# Patient Record
Sex: Male | Born: 1985 | Race: White | Hispanic: No | Marital: Single | Smoking: Never smoker
Health system: Southern US, Community
[De-identification: ages and names within clinical notes are randomized; demographics above are authoritative.]

## PROBLEM LIST (undated history)

## (undated) DIAGNOSIS — F32A Depression, unspecified: Secondary | ICD-10-CM

## (undated) DIAGNOSIS — F329 Major depressive disorder, single episode, unspecified: Secondary | ICD-10-CM

---

## 2013-02-08 ENCOUNTER — Emergency Department (HOSPITAL_COMMUNITY)
Admission: EM | Admit: 2013-02-08 | Discharge: 2013-02-08 | Disposition: A | Discharge status: Home / Self Care | Payer: Self-pay | Attending: Emergency Medicine | Admitting: Emergency Medicine

## 2013-02-08 ENCOUNTER — Encounter (HOSPITAL_COMMUNITY): Payer: Self-pay | Admitting: Emergency Medicine

## 2013-02-08 DIAGNOSIS — B029 Zoster without complications: Secondary | ICD-10-CM | POA: Insufficient documentation

## 2013-02-08 LAB — URINALYSIS, ROUTINE W REFLEX MICROSCOPIC
Glucose, UA: NEGATIVE mg/dL
Hgb urine dipstick: NEGATIVE
Specific Gravity, Urine: >1.03 — ABNORMAL HIGH (ref 1.005–1.030)
pH: 6 (ref 5.0–8.0)

## 2013-02-08 MED ORDER — OXYCODONE-ACETAMINOPHEN 5-325 MG PO TABS: 1.0000 | ORAL_TABLET | Freq: Four times a day (QID) | ORAL | Status: DC | PRN

## 2013-02-08 MED ORDER — VALACYCLOVIR HCL 1 G PO TABS: 1000.0000 mg | ORAL_TABLET | Freq: Three times a day (TID) | ORAL | Status: AC

## 2013-02-08 MED ORDER — PREDNISONE 20 MG PO TABS: ORAL_TABLET | ORAL | Status: DC

## 2013-02-08 NOTE — ED Provider Notes (Signed)
History  This chart was scribed for Donnetta Hutching, MD, by Candelaria Stagers, ED Scribe. This patient was seen in room APA19/[facility] and the patient's care was started at 7:10 AM   CSN: [id number]  Arrival date & time 02/08/13  0610   First MD Initiated Contact with Patient 02/08/13 [phone]      Chief Complaint  Patient presents with  . Abdominal Pain     The history is provided by the patient. No language interpreter was used.   Scott Mcgrath is a 27 y.o. male who presents to the Emergency Department complaining of a painful rash to the right mid back that extends to his right lateral abdomen which started about one week ago.  Pt reports the pain is worse with lifting heavy objects at work.  He denies any associated sx.  Nothing seems to make the sx better or worse.  He has never experienced a similar rash.      History reviewed. No pertinent past medical history.  History reviewed. No pertinent past surgical history.  No family history on file.  History  Substance Use Topics  . Smoking status: Never Smoker   . Smokeless tobacco: Not on file  . Alcohol Use: Yes      Review of Systems  Constitutional: Negative for fever and chills.  Skin: Positive for rash (to right lower back and right lateral abdomen).  All other systems reviewed and are negative.    Allergies  Review of patient's allergies indicates no known allergies.  Home Medications  No current outpatient prescriptions on file.  BP 137/71  Pulse 83  Temp(Src) 97.9 F (36.6 C) (Oral)  Resp 20  Ht 6' (1.829 m)  Wt 170 lb (77.111 kg)  BMI 23.05 kg/m2  SpO2 100%  Physical Exam  Nursing note and vitals reviewed. Constitutional: He is oriented to person, place, and time. He appears well-developed and well-nourished. No distress.  HENT:  Head: Normocephalic and atraumatic.  Eyes: Conjunctivae and EOM are normal.  Neck: Neck supple. No tracheal deviation present.  Cardiovascular: Normal rate.   Pulmonary/Chest:  Effort normal. No respiratory distress.  Abdominal: He exhibits no distension.  Musculoskeletal: Normal range of motion.  Neurological: He is alert and oriented to person, place, and time. No sensory deficit.  Skin: Skin is dry.  Right mid lower back area with an area about 12 cm in diameter of erythematous papular excoriation with some blistering.  On right lateral abdomen there is a linear patch about 1 x 7 cm of the same description.  Psychiatric: He has a normal mood and affect. His behavior is normal.    ED Course  Procedures   DIAGNOSTIC STUDIES: Oxygen Saturation is 100% on room air, normal by my interpretation.    COORDINATION OF CARE:  7:15 AM Discussed course of care for shingles which includes antiviral medication.  Pt understands and agrees.     Labs Reviewed  URINALYSIS, ROUTINE W REFLEX MICROSCOPIC - Abnormal; Notable for the following:    APPearance HAZY (*)    Specific Gravity, Urine >1.030 (*)    All other components within normal limits   No results found.   No diagnosis found.    MDM  History and physical consistent with shingle in approximately T 8 dermatome.  Rx antiviral, prednisone,  Pain medication I personally performed the services described in this documentation, which was scribed in my presence. The recorded information has been reviewed and is accurate.  Donnetta Hutching, MD 02/08/13 [phone]

## 2013-02-08 NOTE — ED Notes (Signed)
DR. Adriana Simas at bedside,

## 2013-02-08 NOTE — ED Notes (Signed)
Patient c/o right lower quadrant pain and right lower abdominal pain x 3 days.  Patient denies any nausea, vomiting, diarrhea or urinary symptoms.  Patient ambulatory with steady gait.  A&O; skin w/d. Respirations even and unlabored; able to speak in complete sentences without difficulty.  Patient states had poison in same areas.

## 2014-10-26 ENCOUNTER — Emergency Department: Payer: Self-pay | Admitting: Internal Medicine

## 2014-11-03 ENCOUNTER — Emergency Department: Payer: Self-pay | Admitting: Emergency Medicine

## 2015-05-24 ENCOUNTER — Other Ambulatory Visit: Payer: Self-pay

## 2015-05-24 ENCOUNTER — Emergency Department
Admission: EM | Admit: 2015-05-24 | Discharge: 2015-05-24 | Disposition: A | Discharge status: Home / Self Care | Payer: TRICARE For Life (TFL) | Attending: Emergency Medicine | Admitting: Emergency Medicine

## 2015-05-24 ENCOUNTER — Encounter: Payer: Self-pay | Admitting: Emergency Medicine

## 2015-05-24 DIAGNOSIS — R112 Nausea with vomiting, unspecified: Secondary | ICD-10-CM | POA: Insufficient documentation

## 2015-05-24 DIAGNOSIS — R51 Headache: Secondary | ICD-10-CM | POA: Insufficient documentation

## 2015-05-24 DIAGNOSIS — F329 Major depressive disorder, single episode, unspecified: Secondary | ICD-10-CM | POA: Insufficient documentation

## 2015-05-24 DIAGNOSIS — T672XXA Heat cramp, initial encounter: Secondary | ICD-10-CM | POA: Insufficient documentation

## 2015-05-24 DIAGNOSIS — X30XXXA Exposure to excessive natural heat, initial encounter: Secondary | ICD-10-CM | POA: Insufficient documentation

## 2015-05-24 HISTORY — DX: Depression, unspecified: F32.A

## 2015-05-24 HISTORY — DX: Major depressive disorder, single episode, unspecified: F32.9

## 2015-05-24 LAB — CBC WITH DIFFERENTIAL/PLATELET
BASOS PCT: 0 %
Basophils Absolute: 0 10*3/uL (ref 0–0.1)
EOS PCT: 2 %
Eosinophils Absolute: 0.2 10*3/uL (ref 0–0.7)
HCT: 47.5 % (ref 40.0–52.0)
HEMOGLOBIN: 16.5 g/dL (ref 13.0–18.0)
LYMPHS PCT: 39 %
Lymphs Abs: 5.1 10*3/uL — ABNORMAL HIGH (ref 1.0–3.6)
MCH: 30.3 pg (ref 26.0–34.0)
MCHC: 34.6 g/dL (ref 32.0–36.0)
MCV: 87.6 fL (ref 80.0–100.0)
MONO ABS: 0.6 10*3/uL (ref 0.2–1.0)
Monocytes Relative: 5 %
NEUTROS PCT: 54 %
Neutro Abs: 7 10*3/uL — ABNORMAL HIGH (ref 1.4–6.5)
Platelets: 293 10*3/uL (ref 150–440)
RBC: 5.42 MIL/uL (ref 4.40–5.90)
RDW: 12.8 % (ref 11.5–14.5)
WBC: 12.9 10*3/uL — ABNORMAL HIGH (ref 3.8–10.6)

## 2015-05-24 LAB — COMPREHENSIVE METABOLIC PANEL
ALT: 25 U/L (ref 17–63)
ANION GAP: 18 — AB (ref 5–15)
AST: 31 U/L (ref 15–41)
Albumin: 5.4 g/dL — ABNORMAL HIGH (ref 3.5–5.0)
Alkaline Phosphatase: 59 U/L (ref 38–126)
BUN: 20 mg/dL (ref 6–20)
CHLORIDE: 107 mmol/L (ref 101–111)
CO2: 17 mmol/L — ABNORMAL LOW (ref 22–32)
Calcium: 10.4 mg/dL — ABNORMAL HIGH (ref 8.9–10.3)
Creatinine, Ser: 1.65 mg/dL — ABNORMAL HIGH (ref 0.61–1.24)
GFR calc Af Amer: >60 mL/min (ref >60)
GFR, EST NON AFRICAN AMERICAN: 55 mL/min — AB (ref >60)
GLUCOSE: 141 mg/dL — AB (ref 65–99)
Potassium: 3.6 mmol/L (ref 3.5–5.1)
Sodium: 142 mmol/L (ref 135–145)
TOTAL PROTEIN: 8.6 g/dL — AB (ref 6.5–8.1)
Total Bilirubin: 1.5 mg/dL — ABNORMAL HIGH (ref 0.3–1.2)

## 2015-05-24 MED ORDER — METOCLOPRAMIDE HCL 5 MG/ML IJ SOLN
10.0000 mg | Freq: Once | INTRAMUSCULAR | Status: AC
  Filled 2015-05-24: qty 2

## 2015-05-24 MED ORDER — SODIUM CHLORIDE 0.9 % IV BOLUS (SEPSIS)
1000.0000 mL | Freq: Once | INTRAVENOUS | Status: AC
  Administered 2015-05-24: 1000 mL via INTRAVENOUS

## 2015-05-24 MED ORDER — ONDANSETRON HCL 4 MG/2ML IJ SOLN: 4.0000 mg | Freq: Once | INTRAMUSCULAR | Status: DC

## 2015-05-24 MED ORDER — METOCLOPRAMIDE HCL 5 MG/ML IJ SOLN
INTRAMUSCULAR | Status: AC
  Administered 2015-05-24: 10 mg via INTRAVENOUS
  Filled 2015-05-24: qty 2

## 2015-05-24 NOTE — Discharge Instructions (Signed)
It is not clear if you're nausea and vomiting is due to your current medicines, due to heat illness, or due to a combination of both. Follow-up with your doctors at the [facility] to discuss changes, if needed, and your medications. The sure to drink plenty of fluid. Return the emergency department if you have any further symptoms or urgent concerns.  Heat-Related Illness Heat-related illnesses occur when the body is unable to properly cool itself. The body normally cools itself by sweating. However, under some conditions sweating is not enough. In these cases, a person's body temperature rises rapidly. Very high body temperatures may damage the brain or other vital organs. Some examples of heat-related illnesses include:  Heat stroke. This occurs when the body is unable to regulate its temperature. The body's temperature rises rapidly, the sweating mechanism fails, and the body is unable to cool down. Body temperature may rise to 106 F (41 C) or higher within 10 to 15 minutes. Heat stroke can cause death or permanent disability if emergency treatment is not provided.  Heat exhaustion. This is a milder form of heat-related illness that can develop after several days of exposure to high temperatures and not enough fluids. It is the body's response to an excessive loss of the water and salt contained in sweat.  Heat cramps. These usually affect people who sweat a lot during heavy activity. This sweating drains the body's salt and moisture. The low salt level in the muscles causes painful cramps. Heat cramps may also be a symptom of heat exhaustion. Heat cramps usually occur in the abdomen, arms, or legs. Get medical attention for cramps if you have heart problems or are on a low-sodium diet. Those that are at greatest risk for heat-related illnesses include:   The elderly.  Infant and the very young.  People with mental illness and chronic diseases.  People who are overweight (obese).  Young and healthy  people can even succumb to heat if they participate in strenuous physical activities during hot weather. CAUSES  Several factors affect the body's ability to cool itself during extremely hot weather. When the humidity is high, sweat will not evaporate as quickly. This prevents the body from releasing heat quickly. Other factors that can affect the body's ability to cool down include:   Age.  Obesity.  Fever.  Dehydration.  Heart disease.  Mental illness.  Poor circulation.  Sunburn.  Prescription drug use.  Alcohol use. SYMPTOMS  Heat stroke: Warning signs of heat stroke vary, but may include:  An extremely high body temperature (above 103F orally).  A fast, strong pulse.  Dizziness.  Confusion.  Red, hot, and dry skin.  No sweating.  Throbbing headache.  Feeling sick to your stomach (nauseous).  Unconsciousness. Heat exhaustion: Warning signs of heat exhaustion include:  Heavy sweating.  Tiredness.  Headache.  Paleness.  Weakness.  Feeling sick to your stomach (nauseous) or vomiting.  Muscle cramps. Heat cramps  Muscle pains or spasms. TREATMENT  Heat stroke  Get into a cool environment. An indoor place that is air-conditioned may be best.  Take a cool shower or bath. Have someone around to make sure you are okay.  Take your temperature. Make sure it is going down. Heat exhaustion  Drink plenty of fluids. Do not drink liquids that contain caffeine, alcohol, or large amounts of sugar. These cause you to lose more body fluid. Also, avoid very cold drinks. They can cause stomach cramps.  Get into a cool environment. An indoor place  that is air-conditioned may be best.  Take a cool shower or bath. Have someone around to make sure you are okay.  Put on lightweight clothing. Heat cramps  Stop whatever activity you were doing. Do not attempt to do that activity for at least 3 hours after the cramps have gone away.  Get into a cool  environment. An indoor place that is air-conditioned may be best. HOME CARE INSTRUCTIONS  To protect your health when temperatures are extremely high, follow these tips:  During heavy exercise in a hot environment, drink two to four glasses (16-32 ounces) of cool fluids each hour. Do not wait until you are thirsty to drink. Warning: If your caregiver limits the amount of fluid you drink or has you on water pills, ask how much you should drink while the weather is hot.  Do not drink liquids that contain caffeine, alcohol, or large amounts of sugar. These cause you to lose more body fluid.  Avoid very cold drinks. They can cause stomach cramps.  Wear appropriate clothing. Choose lightweight, light-colored, loose-fitting clothing.  If you must be outdoors, try to limit your outdoor activity to morning and evening hours. Try to rest often in shady areas.  If you are not used to working or exercising in a hot environment, start slowly and pick up the pace gradually.  Stay cool in an air-conditioned place if possible. If your home does not have air conditioning, go to the shopping mall or [employer].  Taking a cool shower or bath may help you cool off. SEEK MEDICAL CARE IF:   You see any of the symptoms listed above. You may be dealing with a life-threatening emergency.  Symptoms worsen or last longer than 1 hour.  Heat cramps do not get better in 1 hour. MAKE SURE YOU:   Understand these instructions.  Will watch your condition.  Will get help right away if you are not doing well or get worse. Document Released: 08/07/2008 Document Revised: 01/21/2012 Document Reviewed: 08/07/2008 [facility] Patient Information 2015 [place], [place]. This information is not intended to replace advice given to you by your health care provider. Make sure you discuss any questions you have with your health care provider.

## 2015-05-24 NOTE — ED Notes (Addendum)
Pt to triage very weak and sweaty; reports he's been working outside today since 730 am. Pt reports drinking water. Pt's shirt is soaked in sweat. Pt vomiting in triage room. Coworker reports pt was vomiting before work; pt reports he started taking cymbalta and trazodone Sunday, has been vomiting daily since Monday.

## 2015-05-24 NOTE — ED Provider Notes (Signed)
[facility] Emergency Department Provider Note  ____________________________________________  Time seen: 64  I have reviewed the triage vital signs and the nursing notes.   HISTORY  Chief Complaint Heat Exposure  nausea, vomiting    HPI Scott Mcgrath is a 29 y.o. male who began taking new medications last Friday, began feeling nauseous on Sunday, in became nauseous and began vomiting today while working outside.  He began taking Cymbalta and trazodone on Friday. The patient reports a history of depression. These medicines are prescribed at the [facility].  The patient reports having nausea since Sunday. This morning he woke up with a little bit of nausea but otherwise felt all right. 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  30 minutes prior to his arrival, the patient began to feel increasingly nauseous and vomiting. His hands started to spasm and cramps. He felt very uncomfortable. He presents to the emergency department drenched in sweat.  He reports he has been drinking a fair amount of foreign Gatorade during the day. He denies any near syncope, chest pain, shortness breath, or abdominal pain. He has had a small amount of diarrhea earlier today.    Past Medical History  Diagnosis Date  . Depression     There are no active problems to display for this patient.   History reviewed. No pertinent past surgical history.  Current Outpatient Rx  Name  Route  Sig  Dispense  Refill  . oxyCODONE-acetaminophen (PERCOCET/ROXICET) 5-325 MG per tablet   Oral   Take 1-2 tablets by mouth every 6 (six) hours as needed for pain.   20 tablet   0   . predniSONE (DELTASONE) 20 MG tablet      3 tabs po daily x 3 days, then 2 tabs x 3 days, then 1.5 tabs x 3 days, then 1 tab x 3 days, then 0.5 tabs x 3 days   27 tablet   0     Allergies Review of patient's allergies indicates no known allergies.  No family history on file.  Social History History   Substance Use Topics  . Smoking status: Never Smoker   . Smokeless tobacco: Current User    Types: Chew  . Alcohol Use: No    Review of Systems  Constitutional: Negative for fever. ENT: Negative for sore throat. Cardiovascular: Negative for chest pain. Respiratory: Negative for shortness of breath. Gastrointestinal: Negative for abdominal pain, positive for vomiting and diarrhea. See history of present illness Genitourinary: Negative for dysuria. Musculoskeletal: No myalgias or injuries. Skin: Negative for rash. Neurological: New-onset headache currently. Psychological: History of depression, recently started on Cymbalta and trazodone.  10-point ROS otherwise negative.  ____________________________________________   PHYSICAL EXAM:   VITAL SIGNS: ED Triage Vitals  Enc Vitals Group     BP 05/24/15 1442 119/70 mmHg     Pulse Rate 05/24/15 1442 87     Resp 05/24/15 1442 24     Temp --      Temp src --      SpO2 05/24/15 1442 95 %     Weight 05/24/15 1442 180 lb (81.647 kg)     Height --      Head Cir --      Peak Flow --      Pain Score 05/24/15 1445 0     Pain Loc --      Pain Edu? --      Excl. in GC? --     Constitutional: Alert and oriented. A  bit red in the face, cool and a little clammy to touch at this time. ENT   Head: Normocephalic and atraumatic.   Nose: No congestion/rhinnorhea.   Mouth/Throat: Mucous membranes are moist. Cardiovascular: Normal rate, regular rhythm, no murmur noted Respiratory:  Normal respiratory effort, no tachypnea.    Breath sounds are clear and equal bilaterally.  Gastrointestinal: Soft and nontender. No distention.  Back: No muscle spasm, no tenderness, no CVA tenderness. Musculoskeletal: No deformity noted. Nontender with normal range of motion in all extremities.  No noted edema. Neurologic:  Normal speech and language. No gross focal neurologic deficits are appreciated.  Skin:  Skin is cool and clammy.   Psychiatric: Mood and affect are normal. Speech and behavior are normal.  ____________________________________________    LABS (pertinent positives/negatives)  Labs Reviewed  COMPREHENSIVE METABOLIC PANEL - Abnormal; Notable for the following:    CO2 17 (*)    Glucose, Bld 141 (*)    Creatinine, Ser 1.65 (*)    Calcium 10.4 (*)    Total Protein 8.6 (*)    Albumin 5.4 (*)    Total Bilirubin 1.5 (*)    GFR calc non Af Amer 55 (*)    Anion gap 18 (*)    All other components within normal limits  CBC WITH DIFFERENTIAL/PLATELET - Abnormal; Notable for the following:    WBC 12.9 (*)    Neutro Abs 7.0 (*)    Lymphs Abs 5.1 (*)    All other components within normal limits  URINALYSIS COMPLETEWITH MICROSCOPIC ([facility] ONLY)     ____________________________________________   EKG  ED ECG REPORT I, Peja Allender W, the attending physician, personally viewed and interpreted this ECG.   Date: 05/24/2015  EKG Time: 1443  Rate: 84  Rhythm: Normal sinus rhythm  Axis: Normal  Intervals: Normal  ST&T Change: None noted   ____________________________________________   INITIAL IMPRESSION / ASSESSMENT AND PLAN / ED COURSE  Pertinent labs & imaging results that were available during my care of the patient were reviewed by me and considered in my medical decision making (see chart for details).  Is not clear if this is heat frustration versus a different cause for his nausea with some mild diarrhea earlier today. The patient has been taking adequate by mouth fluids. Today is not as hot as it has recently been. We do not have a temperature posted for this patient yet but will obtain one. I suspect that the patient is having difficulty due to the nausea and vomiting his head directly.  Regardless, he will receive 1 L of normal saline IV. I will give him some Reglan to treat the nausea but also because the patient is complaining of a migraine  headache.  ----------------------------------------- 4:29 PM on 05/24/2015 -----------------------------------------  Patient's blood tests overall look fairly good. He has some mild elevation in his creatinine at 1.65, which given his muscular fit body habitus does not surprise me too much. His CO2 is low at 17. The patient was rather anxious and uncomfortable and hyperventilating when he first arrived. Glucose is 141.  Patient has improved. He feels well. His headache is improved. He is not having any nausea at this time.  His temperature this time is 97.8.  It's unclear if this was purely related or medication related or a combination. I suspect combination.  I've recommended that he follow-up at the [facility] [place] [facility] for further discussion about his current medications.  ____________________________________________   FINAL CLINICAL IMPRESSION(S) / ED DIAGNOSES  Final diagnoses:  Nausea and vomiting, vomiting of unspecified type  Heat cramp, initial encounter      Darien Ramusavid W Aliesha Dolata, MD 05/24/15 [phone]

## 2015-09-15 ENCOUNTER — Encounter (HOSPITAL_COMMUNITY): Payer: Self-pay | Admitting: Emergency Medicine

## 2015-09-15 ENCOUNTER — Emergency Department (HOSPITAL_COMMUNITY)
Admission: EM | Admit: 2015-09-15 | Discharge: 2015-09-16 | Disposition: A | Discharge status: Home / Self Care | Payer: Self-pay | Attending: Emergency Medicine | Admitting: Emergency Medicine

## 2015-09-15 ENCOUNTER — Emergency Department (HOSPITAL_COMMUNITY): Payer: TRICARE For Life (TFL)

## 2015-09-15 DIAGNOSIS — R197 Diarrhea, unspecified: Secondary | ICD-10-CM | POA: Insufficient documentation

## 2015-09-15 DIAGNOSIS — Z8659 Personal history of other mental and behavioral disorders: Secondary | ICD-10-CM | POA: Insufficient documentation

## 2015-09-15 DIAGNOSIS — R112 Nausea with vomiting, unspecified: Secondary | ICD-10-CM | POA: Insufficient documentation

## 2015-09-15 DIAGNOSIS — J40 Bronchitis, not specified as acute or chronic: Secondary | ICD-10-CM | POA: Insufficient documentation

## 2015-09-15 DIAGNOSIS — Z7952 Long term (current) use of systemic steroids: Secondary | ICD-10-CM | POA: Insufficient documentation

## 2015-09-15 DIAGNOSIS — M6281 Muscle weakness (generalized): Secondary | ICD-10-CM | POA: Insufficient documentation

## 2015-09-15 LAB — CBC WITH DIFFERENTIAL/PLATELET
BASOS PCT: 0 %
Basophils Absolute: 0 10*3/uL (ref 0.0–0.1)
EOS ABS: 0.1 10*3/uL (ref 0.0–0.7)
Eosinophils Relative: 1 %
HCT: 45.4 % (ref 39.0–52.0)
HEMOGLOBIN: 16.3 g/dL (ref 13.0–17.0)
LYMPHS ABS: 0.6 10*3/uL — AB (ref 0.7–4.0)
Lymphocytes Relative: 6 %
MCH: 30.9 pg (ref 26.0–34.0)
MCHC: 35.9 g/dL (ref 30.0–36.0)
MCV: 86.1 fL (ref 78.0–100.0)
Monocytes Absolute: 0.8 10*3/uL (ref 0.1–1.0)
Monocytes Relative: 7 %
NEUTROS PCT: 86 %
Neutro Abs: 9.8 10*3/uL — ABNORMAL HIGH (ref 1.7–7.7)
Platelets: 219 10*3/uL (ref 150–400)
RBC: 5.27 MIL/uL (ref 4.22–5.81)
RDW: 12.3 % (ref 11.5–15.5)
WBC: 11.3 10*3/uL — AB (ref 4.0–10.5)

## 2015-09-15 LAB — BASIC METABOLIC PANEL
ANION GAP: 12 (ref 5–15)
BUN: 17 mg/dL (ref 6–20)
CHLORIDE: 104 mmol/L (ref 101–111)
CO2: 23 mmol/L (ref 22–32)
Calcium: 9.6 mg/dL (ref 8.9–10.3)
Creatinine, Ser: 1.28 mg/dL — ABNORMAL HIGH (ref 0.61–1.24)
GFR calc non Af Amer: >60 mL/min (ref >60)
Glucose, Bld: 105 mg/dL — ABNORMAL HIGH (ref 65–99)
POTASSIUM: 3.7 mmol/L (ref 3.5–5.1)
SODIUM: 139 mmol/L (ref 135–145)

## 2015-09-15 MED ORDER — DIPHENHYDRAMINE HCL 50 MG/ML IJ SOLN
25.0000 mg | Freq: Once | INTRAMUSCULAR | Status: AC
  Administered 2015-09-15: 25 mg via INTRAVENOUS
  Filled 2015-09-15: qty 1

## 2015-09-15 MED ORDER — SODIUM CHLORIDE 0.9 % IV SOLN
1000.0000 mL | Freq: Once | INTRAVENOUS | Status: AC
  Administered 2015-09-15: 1000 mL via INTRAVENOUS

## 2015-09-15 MED ORDER — METOCLOPRAMIDE HCL 5 MG/ML IJ SOLN
10.0000 mg | Freq: Once | INTRAMUSCULAR | Status: AC
  Administered 2015-09-15: 10 mg via INTRAVENOUS
  Filled 2015-09-15: qty 2

## 2015-09-15 MED ORDER — SODIUM CHLORIDE 0.9 % IV SOLN
1000.0000 mL | INTRAVENOUS | Status: DC
  Administered 2015-09-15: 1000 mL via INTRAVENOUS

## 2015-09-15 MED ORDER — SODIUM CHLORIDE 0.9 % IV SOLN
Freq: Once | INTRAVENOUS | Status: AC
  Administered 2015-09-15: 23:00:00 via INTRAVENOUS

## 2015-09-15 NOTE — ED Provider Notes (Signed)
CSN: [id number]     Arrival date & time 09/15/15  2207 History   By signing my name below, I, Murriel Hopper, attest that this documentation has been prepared under the direction and in the presence of Devoria Albe, MD at 23:06. Electronically Signed: Murriel Hopper, ED Scribe. 09/15/2015. 11:18 PM.  Chief Complaint  Patient presents with  . Emesis  . Diarrhea     The history is provided by the patient. No language interpreter was used.   HPI Comments: Scott Mcgrath is a 29 y.o. male who presents to the Emergency Department complaining of constant, worsening vomiting with associated diarrhea and a productive cough that has been present since earlier today. Pt reports waking up at 0600 this morning and vomiting, and reports vomiting about once every hour since it began. Pt reports in the last three hours he has vomited constantly. Pt also states he has had diarrhea 5-6x per day for about 3 days, and reports his stools have been loose and watery, but more watery. Pt reports he has not eaten any food all day, and also states that his legs feel weak. Pt states he began coughing up white, sometimes green phlegm two days ago as well. Pt denies taking any medication to treat his symptoms. Pt denies abdominal pain, chest pain, shortness of breath or fever. He states his abdomen is sore from vomiting. No one else is sick at home.  No known food exposure making him ill.   PCP [facility] in [place]  Past Medical History  Diagnosis Date  . Depression    History reviewed. No pertinent past surgical history. History reviewed. No pertinent family history. Social History  Substance Use Topics  . Smoking status: Never Smoker   . Smokeless tobacco: Current User    Types: Chew  . Alcohol Use: Yes     Comment: occasionally  employed Lives with spouse Last drank 3 weeks ago  Review of Systems  Constitutional: Positive for appetite change. Negative for fever.  Respiratory: Positive for cough.    Gastrointestinal: Positive for nausea, vomiting and diarrhea. Negative for abdominal pain.  Neurological: Positive for weakness.  All other systems reviewed and are negative.     Allergies  Review of patient's allergies indicates no known allergies.  Home Medications   Prior to Admission medications   Medication Sig Start Date End Date Taking? Authorizing Provider  ondansetron (ZOFRAN ODT) 4 MG disintegrating tablet Take 1 tablet (4 mg total) by mouth every 8 (eight) hours as needed for nausea or vomiting. 09/16/15   Devoria Albe, MD  oxyCODONE-acetaminophen (PERCOCET/ROXICET) 5-325 MG per tablet Take 1-2 tablets by mouth every 6 (six) hours as needed for pain. 02/08/13   Donnetta Hutching, MD  predniSONE (DELTASONE) 20 MG tablet 3 tabs po daily x 3 days, then 2 tabs x 3 days, then 1.5 tabs x 3 days, then 1 tab x 3 days, then 0.5 tabs x 3 days 02/08/13   Donnetta Hutching, MD   BP 102/60 mmHg  Pulse 95  Temp(Src) 98.8 F (37.1 C) (Oral)  Resp 16  Ht 6' (1.829 m)  Wt 170 lb (77.111 kg)  BMI 23.05 kg/m2  SpO2 97%  Vital signs normal   Physical Exam  Constitutional: He is oriented to person, place, and time. He appears well-developed and well-nourished.  Non-toxic appearance. He does not appear ill. No distress.  HENT:  Head: Normocephalic and atraumatic.  Right Ear: External ear normal.  Left Ear: External ear normal.  Nose: Nose  normal. No mucosal edema or rhinorrhea.  Mouth/Throat: Oropharynx is clear and moist and mucous membranes are normal. No dental abscesses or uvula swelling.  Eyes: Conjunctivae and EOM are normal. Pupils are equal, round, and reactive to light.  Neck: Normal range of motion and full passive range of motion without pain. Neck supple.  Cardiovascular: Normal rate, regular rhythm and normal heart sounds.  Exam reveals no gallop and no friction rub.   No murmur heard. Pulmonary/Chest: Effort normal and breath sounds normal. No respiratory distress. He has no wheezes. He  has no rhonchi. He has no rales. He exhibits no tenderness and no crepitus.  Abdominal: Soft. Normal appearance and bowel sounds are normal. He exhibits no distension. There is no tenderness. There is no rebound and no guarding.  Musculoskeletal: Normal range of motion. He exhibits no edema or tenderness.  Moves all extremities well.   Neurological: He is alert and oriented to person, place, and time. He has normal strength. No cranial nerve deficit.  Skin: Skin is warm, dry and intact. No rash noted. No erythema. No pallor.  petechiae on scalp from vomiting  Psychiatric: He has a normal mood and affect. His speech is normal and behavior is normal. His mood appears not anxious.  Nursing note and vitals reviewed.   ED Course  Procedures (including critical care time)  Medications  0.9 %  sodium chloride infusion (0 mLs Intravenous Stopped 09/16/15 0007)    Followed by  0.9 %  sodium chloride infusion (0 mLs Intravenous Stopped 09/16/15 0137)    Followed by  0.9 %  sodium chloride infusion (1,000 mLs Intravenous New Bag/Given 09/15/15 2327)  0.9 %  sodium chloride infusion ( Intravenous Stopped 09/15/15 2327)  metoCLOPramide (REGLAN) injection 10 mg (10 mg Intravenous Given 09/15/15 2324)  diphenhydrAMINE (BENADRYL) injection 25 mg (25 mg Intravenous Given 09/15/15 2324)  sodium chloride 0.9 % bolus 1,000 mL (0 mLs Intravenous Stopped 09/16/15 0208)  ondansetron (ZOFRAN) injection 4 mg (4 mg Intravenous Given 09/16/15 0202)     DIAGNOSTIC STUDIES: Oxygen Saturation is 97% on room air, normal by my interpretation.    COORDINATION OF CARE: 11:13 PM Discussed treatment plan with pt at bedside and pt agreed to plan.   12:52 AM Re-check: pt reports his nausea has improved, but he has not had any urinary output yet despite receiving fluids. Is willing to try oral fluid challenge. We discussed his CXR and need to f/u with outpatient chest CT.    02:00 Pt has been able to drink fluids. He has had  urine output. He has had 4 liters of NS. He still has mild nausea, he was given zofran IV.   Labs Review Results for orders placed or performed during the hospital encounter of 09/15/15  CBC with Differential  Result Value Ref Range   WBC 11.3 (H) 4.0 - 10.5 K/uL   RBC 5.27 4.22 - 5.81 MIL/uL   Hemoglobin 16.3 13.0 - 17.0 g/dL   HCT 16.1 09.6 - 04.5 %   MCV 86.1 78.0 - 100.0 fL   MCH 30.9 26.0 - 34.0 pg   MCHC 35.9 30.0 - 36.0 g/dL   RDW 40.9 81.1 - 91.4 %   Platelets 219 150 - 400 K/uL   Neutrophils Relative % 86 %   Neutro Abs 9.8 (H) 1.7 - 7.7 K/uL   Lymphocytes Relative 6 %   Lymphs Abs 0.6 (L) 0.7 - 4.0 K/uL   Monocytes Relative 7 %   Monocytes Absolute 0.8  0.1 - 1.0 K/uL   Eosinophils Relative 1 %   Eosinophils Absolute 0.1 0.0 - 0.7 K/uL   Basophils Relative 0 %   Basophils Absolute 0.0 0.0 - 0.1 K/uL  Basic metabolic panel  Result Value Ref Range   Sodium 139 135 - 145 mmol/L   Potassium 3.7 3.5 - 5.1 mmol/L   Chloride 104 101 - 111 mmol/L   CO2 23 22 - 32 mmol/L   Glucose, Bld 105 (H) 65 - 99 mg/dL   BUN 17 6 - 20 mg/dL   Creatinine, Ser 1.611.28 (H) 0.61 - 1.24 mg/dL   Calcium 9.6 8.9 - 09.610.3 mg/dL   GFR calc non Af Amer >60 >60 mL/min   GFR calc Af Amer >60 >60 mL/min   Anion gap 12 5 - 15  Urinalysis, Routine w reflex microscopic [facility])  Result Value Ref Range   Color, Urine YELLOW YELLOW   APPearance CLEAR CLEAR   Specific Gravity, Urine 1.025 1.005 - 1.030   pH 6.0 5.0 - 8.0   Glucose, UA NEGATIVE NEGATIVE mg/dL   Hgb urine dipstick NEGATIVE NEGATIVE   Bilirubin Urine NEGATIVE NEGATIVE   Ketones, ur 40 (A) NEGATIVE mg/dL   Protein, ur TRACE (A) NEGATIVE mg/dL   Urobilinogen, UA 0.2 0.0 - 1.0 mg/dL   Nitrite NEGATIVE NEGATIVE   Leukocytes, UA NEGATIVE NEGATIVE  Urine microscopic-add on  Result Value Ref Range   Squamous Epithelial / LPF RARE RARE   WBC, UA 0-2 <3 WBC/hpf   RBC / HPF 0-2 <3 RBC/hpf   Bacteria, UA RARE RARE   Laboratory  interpretation all normal except mild leukocytosis, mild elevation of creatinine and concentrated urine c/w dehydration     Imaging Review Dg Chest 2 View  09/15/2015  CLINICAL DATA:  29 year old with productive cough, vomiting and diarrhea that began earlier today. EXAM: CHEST  2 VIEW COMPARISON:  None. FINDINGS: Cardiac silhouette normal in size. Bilateral hilar prominence. Hilar and mediastinal contours otherwise unremarkable. Mild to moderate central peribronchial thickening. Lungs otherwise clear. No localized airspace consolidation. No pleural effusions. No pneumothorax. Normal pulmonary vascularity. Visualized bony thorax intact. IMPRESSION: 1. Mild to moderate changes of bronchitis and/or asthma without focal airspace pneumonia. 2. Bilateral hilar prominence. While this may just reflect prominent central pulmonary arteries, lymphadenopathy is not entirely excluded. The patient may benefit from a non-emergent CT of the chest with contrast after treatment of the acute illness. Electronically Signed   By: Hulan Saashomas  Lawrence M.D.   On: 09/15/2015 23:48   I have personally reviewed and evaluated these images and lab results as part of my medical decision-making.    MDM   Final diagnoses:  Nausea vomiting and diarrhea  Bronchitis    New Prescriptions   ONDANSETRON (ZOFRAN ODT) 4 MG DISINTEGRATING TABLET    Take 1 tablet (4 mg total) by mouth every 8 (eight) hours as needed for nausea or vomiting.   Plan discharge  Devoria AlbeIva Burr Soffer, MD, FACEP   .  I personally performed the services described in this documentation, which was scribed in my presence. The recorded information has been reviewed and considered.  Devoria AlbeIva Timesha Cervantez, MD, [facility]    Lashaunta Sicard, MD 09/16/15 [phone]

## 2015-09-15 NOTE — ED Notes (Signed)
Patient is complaining of vomiting and diarrhea starting approximately 3 hours ago. Also complaining of abdominal pain.

## 2015-09-16 LAB — URINALYSIS, ROUTINE W REFLEX MICROSCOPIC
Bilirubin Urine: NEGATIVE
GLUCOSE, UA: NEGATIVE mg/dL
Hgb urine dipstick: NEGATIVE
Ketones, ur: 40 mg/dL — AB
LEUKOCYTES UA: NEGATIVE
Nitrite: NEGATIVE
Specific Gravity, Urine: 1.025 (ref 1.005–1.030)
UROBILINOGEN UA: 0.2 mg/dL (ref 0.0–1.0)
pH: 6 (ref 5.0–8.0)

## 2015-09-16 LAB — URINE MICROSCOPIC-ADD ON

## 2015-09-16 MED ORDER — ONDANSETRON HCL 4 MG/2ML IJ SOLN
4.0000 mg | Freq: Once | INTRAMUSCULAR | Status: AC
  Administered 2015-09-16: 4 mg via INTRAVENOUS
  Filled 2015-09-16: qty 2

## 2015-09-16 MED ORDER — SODIUM CHLORIDE 0.9 % IV BOLUS (SEPSIS)
1000.0000 mL | Freq: Once | INTRAVENOUS | Status: AC
  Administered 2015-09-16: 1000 mL via INTRAVENOUS

## 2015-09-16 MED ORDER — ONDANSETRON 4 MG PO TBDP: 4.0000 mg | ORAL_TABLET | Freq: Three times a day (TID) | ORAL | Status: DC | PRN

## 2015-09-16 NOTE — Discharge Instructions (Signed)
Drink plenty of fluids (clear liquids) the next 12-24 hours then start a bland diet such as toast, crackers, jello, [employer] chicken noodle soup.  Use the zofran for nausea or vomiting. Take imodium OTC for diarrhea. Avoid mild products until the diarrhea is gone. Recheck if you get worse. You have bronchitis, it is most likely a viral illness at this point. Recheck if you get a high fever, you strugle to breathe, get chest pain or if you are still coughing after another week. You can take mucinex DM OTC for your cough. Your chest xray had an enlargement near the heart which could be blood vessels or enlarged lymph nodes. Follow up with your doctor in the next couple of weeks to get a repeat CXR or an outpatient chest CT scan to look at this further.    Food Choices to Help Relieve Diarrhea, Adult When you have diarrhea, the foods you eat and your eating habits are very important. Choosing the right foods and drinks can help relieve diarrhea. Also, because diarrhea can last up to 7 days, you need to replace lost fluids and electrolytes (such as sodium, potassium, and chloride) in order to help prevent dehydration.  WHAT GENERAL GUIDELINES DO I NEED TO FOLLOW?  Slowly drink 1 cup (8 oz) of fluid for each episode of diarrhea. If you are getting enough fluid, your urine will be clear or pale yellow.  Eat starchy foods. Some good choices include white rice, white toast, pasta, low-fiber cereal, baked potatoes (without the skin), saltine crackers, and bagels.  Avoid large servings of any cooked vegetables.  Limit fruit to two servings per day. A serving is  cup or 1 small piece.  Choose foods with less than 2 g of fiber per serving.  Limit fats to less than 8 tsp (38 g) per day.  Avoid fried foods.  Eat foods that have probiotics in them. Probiotics can be found in certain dairy products.  Avoid foods and beverages that may increase the speed at which food moves through the stomach and  intestines (gastrointestinal tract). Things to avoid include:  High-fiber foods, such as dried fruit, raw fruits and vegetables, nuts, seeds, and whole grain foods.  Spicy foods and high-fat foods.  Foods and beverages sweetened with high-fructose corn syrup, honey, or sugar alcohols such as xylitol, sorbitol, and mannitol. WHAT FOODS ARE RECOMMENDED? Grains White rice. White, [place], or pita breads (fresh or toasted), including plain rolls, buns, or bagels. White pasta. Saltine, soda, or graham crackers. Pretzels. Low-fiber cereal. Cooked cereals made with water (such as cornmeal, farina, or cream cereals). Plain muffins. Matzo. Melba toast. Zwieback.  Vegetables Potatoes (without the skin). Strained tomato and vegetable juices. Most well-cooked and canned vegetables without seeds. Tender lettuce. Fruits Cooked or canned applesauce, apricots, cherries, fruit cocktail, grapefruit, peaches, pears, or plums. Fresh bananas, apples without skin, cherries, grapes, cantaloupe, grapefruit, peaches, oranges, or plums.  Meat and Other Protein Products Baked or boiled chicken. Eggs. Tofu. Fish. Seafood. Smooth peanut butter. Ground or well-cooked tender beef, ham, veal, lamb, pork, or poultry.  Dairy Plain yogurt, kefir, and unsweetened liquid yogurt. Lactose-free milk, buttermilk, or soy milk. Plain hard cheese. Beverages Sport drinks. Clear broths. Diluted fruit juices (except prune). Regular, caffeine-free sodas such as ginger ale. Water. Decaffeinated teas. Oral rehydration solutions. Sugar-free beverages not sweetened with sugar alcohols. Other Bouillon, broth, or soups made from recommended foods.  The items listed above may not be a complete list of recommended foods or beverages. Contact  your dietitian for more options. WHAT FOODS ARE NOT RECOMMENDED? Grains Whole grain, whole wheat, bran, or rye breads, rolls, pastas, crackers, and cereals. Wild or brown rice. Cereals that contain more than 2  g of fiber per serving. Corn tortillas or taco shells. Cooked or dry oatmeal. Granola. Popcorn. Vegetables Raw vegetables. Cabbage, broccoli, Brussels sprouts, artichokes, baked beans, beet greens, corn, kale, legumes, peas, sweet potatoes, and yams. Potato skins. Cooked spinach and cabbage. Fruits Dried fruit, including raisins and dates. Raw fruits. Stewed or dried prunes. Fresh apples with skin, apricots, mangoes, pears, raspberries, and strawberries.  Meat and Other Protein Products Chunky peanut butter. Nuts and seeds. Beans and lentils. Tomasa Blase.  Dairy High-fat cheeses. Milk, chocolate milk, and beverages made with milk, such as milk shakes. Cream. Ice cream. Sweets and Desserts Sweet rolls, doughnuts, and sweet breads. Pancakes and waffles. Fats and Oils Butter. Cream sauces. Margarine. Salad oils. Plain salad dressings. Olives. Avocados.  Beverages Caffeinated beverages (such as coffee, tea, soda, or energy drinks). Alcoholic beverages. Fruit juices with pulp. Prune juice. Soft drinks sweetened with high-fructose corn syrup or sugar alcohols. Other Coconut. Hot sauce. Chili powder. Mayonnaise. Gravy. Cream-based or milk-based soups.  The items listed above may not be a complete list of foods and beverages to avoid. Contact your dietitian for more information. WHAT SHOULD I DO IF I BECOME DEHYDRATED? Diarrhea can sometimes lead to dehydration. Signs of dehydration include dark urine and dry mouth and skin. If you think you are dehydrated, you should rehydrate with an oral rehydration solution. These solutions can be purchased at pharmacies, retail stores, or online.  Drink -1 cup (120-240 mL) of oral rehydration solution each time you have an episode of diarrhea. If drinking this amount makes your diarrhea worse, try drinking smaller amounts more often. For example, drink 1-3 tsp (5-15 mL) every 5-10 minutes.  A general rule for staying hydrated is to drink 1-2 L of fluid per day. Talk to  your health care provider about the specific amount you should be drinking each day. Drink enough fluids to keep your urine clear or pale yellow.   This information is not intended to replace advice given to you by your health care provider. Make sure you discuss any questions you have with your health care provider.   Document Released: 01/19/2004 Document Revised: 11/19/2014 Document Reviewed: 09/21/2013 Elsevier Interactive Patient Education 2016 [employer]  Nausea and Vomiting Nausea is a sick feeling that often comes before throwing up (vomiting). Vomiting is a reflex where stomach contents come out of your mouth. Vomiting can cause severe loss of body fluids (dehydration). Children and elderly adults can become dehydrated quickly, especially if they also have diarrhea. Nausea and vomiting are symptoms of a condition or disease. It is important to find the cause of your symptoms. CAUSES   Direct irritation of the stomach lining. This irritation can result from increased acid production (gastroesophageal reflux disease), infection, food poisoning, taking certain medicines (such as nonsteroidal anti-inflammatory drugs), alcohol use, or tobacco use.  Signals from the brain.These signals could be caused by a headache, heat exposure, an inner ear disturbance, increased pressure in the brain from injury, infection, a tumor, or a concussion, pain, emotional stimulus, or metabolic problems.  An obstruction in the gastrointestinal tract (bowel obstruction).  Illnesses such as diabetes, hepatitis, gallbladder problems, appendicitis, kidney problems, cancer, sepsis, atypical symptoms of a heart attack, or eating disorders.  Medical treatments such as chemotherapy and radiation.  Receiving medicine that makes you  sleep (general anesthetic) during surgery. DIAGNOSIS Your caregiver may ask for tests to be done if the problems do not improve after a few days. Tests may also be done if symptoms are  severe or if the reason for the nausea and vomiting is not clear. Tests may include:  Urine tests.  Blood tests.  Stool tests.  Cultures (to look for evidence of infection).  X-rays or other imaging studies. Test results can help your caregiver make decisions about treatment or the need for additional tests. TREATMENT You need to stay well hydrated. Drink frequently but in small amounts.You may wish to drink water, sports drinks, clear broth, or eat frozen ice pops or gelatin dessert to help stay hydrated.When you eat, eating slowly may help prevent nausea.There are also some antinausea medicines that may help prevent nausea. HOME CARE INSTRUCTIONS   Take all medicine as directed by your caregiver.  If you do not have an appetite, do not force yourself to eat. However, you must continue to drink fluids.  If you have an appetite, eat a normal diet unless your caregiver tells you differently.  Eat a variety of complex carbohydrates (rice, wheat, potatoes, bread), lean meats, yogurt, fruits, and vegetables.  Avoid high-fat foods because they are more difficult to digest.  Drink enough water and fluids to keep your urine clear or pale yellow.  If you are dehydrated, ask your caregiver for specific rehydration instructions. Signs of dehydration may include:  Severe thirst.  Dry lips and mouth.  Dizziness.  Dark urine.  Decreasing urine frequency and amount.  Confusion.  Rapid breathing or pulse. SEEK IMMEDIATE MEDICAL CARE IF:   You have blood or brown flecks (like coffee grounds) in your vomit.  You have black or bloody stools.  You have a severe headache or stiff neck.  You are confused.  You have severe abdominal pain.  You have chest pain or trouble breathing.  You do not urinate at least once every 8 hours.  You develop cold or clammy skin.  You continue to vomit for longer than 24 to 48 hours.  You have a fever. MAKE SURE YOU:   Understand these  instructions.  Will watch your condition.  Will get help right away if you are not doing well or get worse.   This information is not intended to replace advice given to you by your health care provider. Make sure you discuss any questions you have with your health care provider.   Document Released: 10/29/2005 Document Revised: 01/21/2012 Document Reviewed: 03/28/2011 Elsevier Interactive Patient Education [employer]

## 2016-12-21 ENCOUNTER — Emergency Department: Payer: Self-pay

## 2016-12-21 ENCOUNTER — Emergency Department
Admission: EM | Admit: 2016-12-21 | Discharge: 2016-12-21 | Disposition: A | Discharge status: Home / Self Care | Payer: Self-pay | Attending: Emergency Medicine | Admitting: Emergency Medicine

## 2016-12-21 ENCOUNTER — Encounter: Payer: Self-pay | Admitting: Emergency Medicine

## 2016-12-21 DIAGNOSIS — T148XXA Other injury of unspecified body region, initial encounter: Secondary | ICD-10-CM

## 2016-12-21 DIAGNOSIS — Y939 Activity, unspecified: Secondary | ICD-10-CM | POA: Insufficient documentation

## 2016-12-21 DIAGNOSIS — F17220 Nicotine dependence, chewing tobacco, uncomplicated: Secondary | ICD-10-CM | POA: Insufficient documentation

## 2016-12-21 DIAGNOSIS — J069 Acute upper respiratory infection, unspecified: Secondary | ICD-10-CM | POA: Insufficient documentation

## 2016-12-21 DIAGNOSIS — X58XXXA Exposure to other specified factors, initial encounter: Secondary | ICD-10-CM | POA: Insufficient documentation

## 2016-12-21 DIAGNOSIS — S46911A Strain of unspecified muscle, fascia and tendon at shoulder and upper arm level, right arm, initial encounter: Secondary | ICD-10-CM | POA: Insufficient documentation

## 2016-12-21 DIAGNOSIS — Y999 Unspecified external cause status: Secondary | ICD-10-CM | POA: Insufficient documentation

## 2016-12-21 DIAGNOSIS — Y929 Unspecified place or not applicable: Secondary | ICD-10-CM | POA: Insufficient documentation

## 2016-12-21 LAB — INFLUENZA PANEL BY PCR (TYPE A & B)
Influenza A By PCR: NEGATIVE
Influenza B By PCR: NEGATIVE

## 2016-12-21 MED ORDER — LIDOCAINE 5 % EX PTCH
1.0000 | MEDICATED_PATCH | CUTANEOUS | Status: DC
  Administered 2016-12-21: 1 via TRANSDERMAL
  Filled 2016-12-21: qty 1

## 2016-12-21 MED ORDER — FLUTICASONE PROPIONATE 50 MCG/ACT NA SUSP: 2.0000 | Freq: Every day | NASAL | 0 refills | Status: DC

## 2016-12-21 MED ORDER — LIDOCAINE 5 % EX PTCH: 1.0000 | MEDICATED_PATCH | Freq: Two times a day (BID) | CUTANEOUS | 0 refills | Status: DC

## 2016-12-21 MED ORDER — ALBUTEROL SULFATE HFA 108 (90 BASE) MCG/ACT IN AERS: 2.0000 | INHALATION_SPRAY | Freq: Four times a day (QID) | RESPIRATORY_TRACT | 0 refills | Status: DC | PRN

## 2016-12-21 MED ORDER — IPRATROPIUM-ALBUTEROL 0.5-2.5 (3) MG/3ML IN SOLN
3.0000 mL | Freq: Once | RESPIRATORY_TRACT | Status: AC
  Administered 2016-12-21: 3 mL via RESPIRATORY_TRACT
  Filled 2016-12-21: qty 3

## 2016-12-21 NOTE — ED Notes (Signed)
AAOx3.  Skin warm and dry.  NAD 

## 2016-12-21 NOTE — ED Provider Notes (Signed)
[facility] Emergency Department Provider Note  ____________________________________________  Time seen: Approximately 2:18 PM  I have reviewed the triage vital signs and the nursing notes.   HISTORY  Chief Complaint Shoulder Pain and Generalized Body Aches    HPI Scott Mcgrath is a 31 y.o. male resents emergency room with right sided chest pain and back pain for 1 day. Patient states that pain is wose with any movement. He states the pain is sharp and does not move from upper right shoulder. No arm pain. Patient describes the pain as stabbing. Patient states that if he pushes down with arm he gets a sharp pain in his back. Patient has congestion, body aches and non productive cough. Patient assumed this was a cold but then got got nervous with the right sided pain. He has taken Mucinex for symptoms. Patient smokes mariajuana 1 or 2 times a day. No trauma. Patient denies fever, chills, sweating,  numbness, tingling, jaw pain, shortness of breath, chest pain, palpitations, nausea, vomiting, abdominal pain.   Past Medical History:  Diagnosis Date  . Depression     There are no active problems to display for this patient.   History reviewed. No pertinent surgical history.  Prior to Admission medications   Medication Sig Start Date End Date Taking? Authorizing Provider  ondansetron (ZOFRAN ODT) 4 MG disintegrating tablet Take 1 tablet (4 mg total) by mouth every 8 (eight) hours as needed for nausea or vomiting. 09/16/15   Devoria Albe, MD  oxyCODONE-acetaminophen (PERCOCET/ROXICET) 5-325 MG per tablet Take 1-2 tablets by mouth every 6 (six) hours as needed for pain. 02/08/13   Donnetta Hutching, MD  predniSONE (DELTASONE) 20 MG tablet 3 tabs po daily x 3 days, then 2 tabs x 3 days, then 1.5 tabs x 3 days, then 1 tab x 3 days, then 0.5 tabs x 3 days 02/08/13   Donnetta Hutching, MD    Allergies Patient has no known allergies.  History reviewed. No pertinent family  history.  Social History Social History  Substance Use Topics  . Smoking status: Never Smoker  . Smokeless tobacco: Current User    Types: Chew  . Alcohol use Yes     Comment: occasionally     Review of Systems  Constitutional: No fever/chills Eyes: No visual changes. No discharge. ENT: Positive for congestion and rhinorrhea. Respiratory: Positive for cough. No SOB. Gastrointestinal: No abdominal pain.  No nausea, no vomiting.  No diarrhea.  No constipation. Skin: Negative for rash, abrasions, lacerations, ecchymosis. Neurological: Negative for headaches.   ____________________________________________   PHYSICAL EXAM:  VITAL SIGNS: ED Triage Vitals  Enc Vitals Group     BP 12/21/16 1122 135/66     Pulse Rate 12/21/16 1122 68     Resp 12/21/16 1122 18     Temp 12/21/16 1122 97.6 F (36.4 C)     Temp Source 12/21/16 1122 Oral     SpO2 12/21/16 1122 100 %     Weight 12/21/16 1122 175 lb (79.4 kg)     Height 12/21/16 1122 6' (1.829 m)     Head Circumference --      Peak Flow --      Pain Score 12/21/16 1130 7     Pain Loc --      Pain Edu? --      Excl. in GC? --      Constitutional: Alert and oriented. Well appearing and in no acute distress. Eyes: Conjunctivae are normal. PERRL. EOMI. No  discharge. Head: Atraumatic. ENT: No frontal and maxillary sinus tenderness.      Ears: Tympanic membranes pearly gray with good landmarks. No discharge.      Nose: Mild congestion/rhinnorhea.      Mouth/Throat: Mucous membranes are moist. Oropharynx non-erythematous. Tonsils not enlarged. No exudates. Uvula midline. Neck: No stridor.   Hematological/Lymphatic/Immunilogical: No cervical lymphadenopathy. Cardiovascular: Normal rate, regular rhythm. Good peripheral circulation. Respiratory: Normal respiratory effort without tachypnea or retractions. Lungs CTAB. Good air entry to the bases with no decreased or absent breath sounds. Gastrointestinal: Bowel sounds 4 quadrants.  Soft and nontender to palpation. No guarding or rigidity. No palpable masses. No distention. Musculoskeletal: Full range of motion to all extremities. No gross deformities appreciated. Tenderness to palpation over medial edge of right scapula. No tenderness to palpation over right shoulder. Neurologic:  Normal speech and language. No gross focal neurologic deficits are appreciated.  Skin:  Skin is warm, dry and intact. No rash noted. Psychiatric: Mood and affect are normal. Speech and behavior are normal. Patient exhibits appropriate insight and judgement.   ____________________________________________   LABS (all labs ordered are listed, but only abnormal results are displayed)  Labs Reviewed  INFLUENZA PANEL BY PCR (TYPE A & B)   ____________________________________________  EKG   ____________________________________________  RADIOLOGY Lexine Baton, personally viewed and evaluated these images (plain radiographs) as part of my medical decision making, as well as reviewing the written report by the radiologist.  Dg Chest 2 View  Result Date: 12/21/2016 CLINICAL DATA:  Chest pain. EXAM: CHEST  2 VIEW COMPARISON:  Radiographs of September 15, 2015. FINDINGS: The heart size and mediastinal contours are within normal limits. Both lungs are clear. No pneumothorax or pleural effusion is noted. The visualized skeletal structures are unremarkable. IMPRESSION: No active cardiopulmonary disease. Electronically Signed   By: Lupita Raider, M.D.   On: 12/21/2016 13:45    ____________________________________________    PROCEDURES  Procedure(s) performed:    Procedures    Medications  ipratropium-albuterol (DUONEB) 0.5-2.5 (3) MG/3ML nebulizer solution 3 mL (3 mLs Nebulization Given 12/21/16 1351)     ____________________________________________   INITIAL IMPRESSION / ASSESSMENT AND PLAN / ED COURSE  Pertinent labs & imaging results that were available during my care of the  patient were reviewed by me and considered in my medical decision making (see chart for details).  Review of the [place] CSRS was performed in accordance of the [employer] prior to dispensing any controlled drugs.     Patient's diagnosis is consistent with upper respiratory infection and muscle strain. Patient presented to the emergency department with right-sided chest pain so EKG and chest xray was done. Patient clarified that right-sided chest pain happens every time patient puts weight on arm or presses at a particular spot the medial side of right scapula. No trauma. Patient has had cough and congestion  for one day. Influenza negative. Symptoms improved after DuoNeb. Patient will be given lidocaine patch for muscle strain on back. Patient will be given albuterol and Flonase for cold symptoms. Patient is to follow up with PCP as needed or otherwise directed. Patient is given ED precautions to return to the ED for any worsening or new symptoms.     ____________________________________________  FINAL CLINICAL IMPRESSION(S) / ED DIAGNOSES  Final diagnoses:  None      NEW MEDICATIONS STARTED DURING THIS VISIT:  New Prescriptions   No medications on file        This chart was dictated using voice recognition  software/Dragon. Despite best efforts to proofread, errors can occur which can change the meaning. Any change was purely unintentional.    Enid DerryAshley Sadik Piascik, PA-C 12/21/16 1724    Enid DerryAshley Yassen Kinnett, PA-C 12/21/16 1724    Emily FilbertJonathan E Williams, MD 12/22/16 [phone]

## 2016-12-21 NOTE — ED Triage Notes (Addendum)
C/o pain to right shoulder blade and arm that is worse with movement. Does not hurt at all if not moving. Does not remember injury. Had episode of vomiting this morning but has not had any since then.  C/o body aches, congestion, and cough. Denies fever.

## 2017-02-23 ENCOUNTER — Emergency Department: Payer: TRICARE For Life (TFL)

## 2017-02-23 ENCOUNTER — Encounter: Payer: Self-pay | Admitting: Emergency Medicine

## 2017-02-23 ENCOUNTER — Emergency Department
Admission: EM | Admit: 2017-02-23 | Discharge: 2017-02-23 | Disposition: A | Discharge status: Home / Self Care | Payer: TRICARE For Life (TFL) | Attending: Emergency Medicine | Admitting: Emergency Medicine

## 2017-02-23 DIAGNOSIS — R55 Syncope and collapse: Secondary | ICD-10-CM

## 2017-02-23 DIAGNOSIS — W1839XA Other fall on same level, initial encounter: Secondary | ICD-10-CM | POA: Insufficient documentation

## 2017-02-23 DIAGNOSIS — F17220 Nicotine dependence, chewing tobacco, uncomplicated: Secondary | ICD-10-CM | POA: Insufficient documentation

## 2017-02-23 DIAGNOSIS — F1210 Cannabis abuse, uncomplicated: Secondary | ICD-10-CM | POA: Insufficient documentation

## 2017-02-23 DIAGNOSIS — R110 Nausea: Secondary | ICD-10-CM

## 2017-02-23 DIAGNOSIS — Y999 Unspecified external cause status: Secondary | ICD-10-CM | POA: Insufficient documentation

## 2017-02-23 DIAGNOSIS — S0003XA Contusion of scalp, initial encounter: Secondary | ICD-10-CM | POA: Insufficient documentation

## 2017-02-23 DIAGNOSIS — Y939 Activity, unspecified: Secondary | ICD-10-CM | POA: Insufficient documentation

## 2017-02-23 DIAGNOSIS — Y929 Unspecified place or not applicable: Secondary | ICD-10-CM | POA: Insufficient documentation

## 2017-02-23 DIAGNOSIS — Z79899 Other long term (current) drug therapy: Secondary | ICD-10-CM | POA: Insufficient documentation

## 2017-02-23 LAB — CBC
HEMATOCRIT: 43.8 % (ref 40.0–52.0)
Hemoglobin: 15.1 g/dL (ref 13.0–18.0)
MCH: 30.4 pg (ref 26.0–34.0)
MCHC: 34.4 g/dL (ref 32.0–36.0)
MCV: 88.2 fL (ref 80.0–100.0)
PLATELETS: 204 10*3/uL (ref 150–440)
RBC: 4.96 MIL/uL (ref 4.40–5.90)
RDW: 12.4 % (ref 11.5–14.5)
WBC: 15.6 10*3/uL — AB (ref 3.8–10.6)

## 2017-02-23 LAB — BASIC METABOLIC PANEL
Anion gap: 7 (ref 5–15)
BUN: 23 mg/dL — AB (ref 6–20)
CHLORIDE: 105 mmol/L (ref 101–111)
CO2: 26 mmol/L (ref 22–32)
Calcium: 9.3 mg/dL (ref 8.9–10.3)
Creatinine, Ser: 1.05 mg/dL (ref 0.61–1.24)
GFR calc Af Amer: >60 mL/min (ref >60)
GFR calc non Af Amer: >60 mL/min (ref >60)
Glucose, Bld: 99 mg/dL (ref 65–99)
POTASSIUM: 3.7 mmol/L (ref 3.5–5.1)
Sodium: 138 mmol/L (ref 135–145)

## 2017-02-23 NOTE — ED Notes (Signed)
Patient transported to CT 

## 2017-02-23 NOTE — ED Provider Notes (Signed)
[facility] Emergency Department Provider Note  ____________________________________________  Time seen: Approximately 7:48 PM  I have reviewed the triage vital signs and the nursing notes.   HISTORY  Chief Complaint Seizures    HPI Scott Mcgrath is a 31 y.o. male with marijuana abuse presenting with an episode of syncope with shaking.  The pt was preparing shrimp while standing at the sink when he became acutely nauseated, and the next thing he remembers is waking up on the floor. His wife heard him fall and came immediately to see him. She noted that his eyes had rolled back, that he became contracted and then he had a brief period of rhythmic movements that lasted approximately 30 seconds. He did not have any postictal state and immediately was back to normal mental status. He did not have any tongue injury. He does report some mild pain on the posterior left scalp. No urinary incontinence. He has no history of febrile seizures as a child, personal seizures, or family history of seizures. He has had recurrent syncope in the past, generally associated with heat exhaustion.   Past Medical History:  Diagnosis Date  . Depression     There are no active problems to display for this patient.   History reviewed. No pertinent surgical history.  Current Outpatient Rx  . Order #: [id number] Class: Print  . Order #: [id number] Class: Print  . Order #: [id number] Class: Print  . Order #: [id number] Class: Print  . Order #: [id number] Class: Print  . Order #: [id number] Class: Print    Allergies Patient has no known allergies.  No family history on file.  Social History Social History  Substance Use Topics  . Smoking status: Never Smoker  . Smokeless tobacco: Current User    Types: Chew  . Alcohol use Yes     Comment: occasionally    Review of Systems Constitutional: No fever/chills. Syncope versus seizure episode. Eyes: No visual changes. ENT: No sore  throat. No congestion or rhinorrhea. Head: Pain on the posterior left scalp. Cardiovascular: Denies chest pain. Denies palpitations. Respiratory: Denies shortness of breath.  No cough. Gastrointestinal: No abdominal pain.  No nausea, no vomiting.  No diarrhea.  No constipation. Genitourinary: Negative for dysuria. Musculoskeletal: Negative for back pain. No neck pain. Skin: Negative for rash. Neurological: Negative for headaches. No focal numbness, tingling or weakness.   10-point ROS otherwise negative.  ____________________________________________   PHYSICAL EXAM:  VITAL SIGNS: ED Triage Vitals  Enc Vitals Group     BP 02/23/17 1910 124/65     Pulse Rate 02/23/17 1910 74     Resp 02/23/17 1910 20     Temp 02/23/17 1910 97.5 F (36.4 C)     Temp Source 02/23/17 1910 Oral     SpO2 02/23/17 1910 100 %     Weight 02/23/17 1910 175 lb (79.4 kg)     Height 02/23/17 1910 6' (1.829 m)     Head Circumference --      Peak Flow --      Pain Score 02/23/17 1912 8     Pain Loc --      Pain Edu? --      Excl. in GC? --     Constitutional: Alert and oriented. Well appearing and in no acute distress. Answers questions appropriately. Eyes: Conjunctivae are normal.  EOMI. No scleral icterus. Head: No tenderness to palpation in the right posterior scalp without ecchymosis, or significant swelling, skull deformity.. Nose: No congestion/rhinnorhea. Mouth/Throat: Mucous membranes are  moist. No dental injury. No tongue injury. Neck: No stridor.  Supple.  No midline C-spine tenderness to palpation, step-offs or deformities. Cardiovascular: Normal rate, regular rhythm. No murmurs, rubs or gallops.  Respiratory: Normal respiratory effort.  No accessory muscle use or retractions. Lungs CTAB.  No wheezes, rales or ronchi. Gastrointestinal: Soft, nontender and nondistended.  No guarding or rebound.  No peritoneal signs. Musculoskeletal: No LE edema. No ttp in the calves or palpable cords.   Negative Homan's sign. Neurologic:  A&Ox3.  Speech is clear.  Face and smile are symmetric.  EOMI.  Moves all extremities well. Skin:  Skin is warm, dry and intact. No rash noted. Psychiatric: Mood and affect are normal. Speech and behavior are normal.  Normal judgement.  ____________________________________________   LABS (all labs ordered are listed, but only abnormal results are displayed)  Labs Reviewed  BASIC METABOLIC PANEL  CBC  CBG MONITORING, ED   ____________________________________________  EKG  ED ECG REPORT I, Rockne Menghini, the attending physician, personally viewed and interpreted this ECG.   Date: 02/23/2017  EKG Time: 2023  Rate: 71  Rhythm: normal sinus rhythm  Axis: normal  Intervals:none  ST&T Change: No STEMI. No prolonged QTC. No hypertrophy. No Brugada syndrome.  ____________________________________________  RADIOLOGY  No results found.  ____________________________________________   PROCEDURES  Procedure(s) performed: None  Procedures  Critical Care performed: No ____________________________________________   INITIAL IMPRESSION / ASSESSMENT AND PLAN / ED COURSE  Pertinent labs & imaging results that were available during my care of the patient were reviewed by me and considered in my medical decision making (see chart for details).  31 y.o. male with an episode of syncope and possible shaking. While seizure is possible, it is also possible that he had some shaking associated with a syncopal episode and he has been known to have recurrent syncope in the past. He does not describe a post ictal state, or any incontinence. We'll get an EKG to evaluate for arrhythmia, basic labs, and a CT of the head to rule out injury from his fall or any intracranial causes of seizure. If his workup is reassuring in the emergency department, we'll plan to discharge him home with PCP follow-up in neurology follow-up. Plan reevaluation for final  disposition.  ----------------------------------------- 8:20 PM on 02/23/2017 -----------------------------------------  The patient has continued to be asymptomatic with stable vital signs in the emergency department. I signed the patient over to Dr. Langston Masker, who will follow up the results of his CT scan and laboratory studies for final disposition.  ____________________________________________  FINAL CLINICAL IMPRESSION(S) / ED DIAGNOSES  Final diagnoses:  Syncope, unspecified syncope type  Contusion of scalp, initial encounter  Nausea  Marijuana abuse         NEW MEDICATIONS STARTED DURING THIS VISIT:  New Prescriptions   No medications on file      Rockne Menghini, MD 02/23/17 2036

## 2017-02-23 NOTE — ED Provider Notes (Signed)
Signout from Dr. Sharma Covert in this patient with repeat syncopal episodes. Plan is follow up with lab results and disposition accordingly.  Physical Exam  BP (!) 90/55   Pulse 60   Temp 97.5 F (36.4 C) (Oral)   Resp 19   Ht 6' (1.829 m)   Wt 175 lb (79.4 kg)   SpO2 96%   BMI 23.73 kg/m  ----------------------------------------- 10:32 PM on 02/23/2017 -----------------------------------------   Physical Exam Patient resting comfortably at this time. No combines at this time. ED Course  Procedures  MDM Appears to have an elevated white blood cell count. Appears to be chronically elevated when compared to previous labs. Electrolytes are reassuring. No acute abnormality on the CT scan. Landa discharge the patient home. Patient aware of the results and willing to comply with disposition.       Myrna Blazer, MD 02/23/17 [phone]

## 2017-02-23 NOTE — ED Notes (Signed)
ED Provider at bedside. 

## 2017-02-23 NOTE — Discharge Instructions (Signed)
Please stop using marijuana, drink plenty of fluids, eat small regular meals throughout the day, and get plenty of rest.  Return to the emergency department if you develop any additional lightheaded or fainting episodes, shaking episodes, pain, fever, or any other symptoms concerning to you.

## 2017-02-23 NOTE — ED Triage Notes (Signed)
Patient with complaint of new onset seizure. Per visitor patient whole body became rigid, clenched teeth and eyes rolled back in his head. Visitor states that the seizer lasted about 30 seconds. Visitor also reports that patient his his head. Patient with complaint of pain to his head and right lower back.

## 2017-04-11 ENCOUNTER — Encounter: Payer: Self-pay | Admitting: Emergency Medicine

## 2017-04-11 ENCOUNTER — Emergency Department: Payer: TRICARE For Life (TFL)

## 2017-04-11 ENCOUNTER — Emergency Department
Admission: EM | Admit: 2017-04-11 | Discharge: 2017-04-11 | Disposition: A | Discharge status: Home / Self Care | Payer: TRICARE For Life (TFL) | Attending: Emergency Medicine | Admitting: Emergency Medicine

## 2017-04-11 DIAGNOSIS — J069 Acute upper respiratory infection, unspecified: Secondary | ICD-10-CM

## 2017-04-11 DIAGNOSIS — Z79899 Other long term (current) drug therapy: Secondary | ICD-10-CM | POA: Insufficient documentation

## 2017-04-11 MED ORDER — BENZONATATE 100 MG PO CAPS: 200.0000 mg | ORAL_CAPSULE | Freq: Three times a day (TID) | ORAL | 0 refills | Status: AC | PRN

## 2017-04-11 MED ORDER — AZITHROMYCIN 250 MG PO TABS: ORAL_TABLET | ORAL | 0 refills | Status: DC

## 2017-04-11 NOTE — ED Triage Notes (Addendum)
Patient ambulatory to triage with steady gait, without difficulty or distress noted; pt reports x week productive cough green sputum and right lateral rib pain that increases with deep breathing

## 2017-04-11 NOTE — Discharge Instructions (Signed)
Begin taking zpack as directed.  Tessalon as needed for cough. Tylenol as needed for fever or pain. Follow up with [facility]

## 2017-04-11 NOTE — ED Provider Notes (Signed)
[facility] Emergency Department Provider Note ____________________________________________  Time seen: 7:17 AM  I have reviewed the triage vital signs and the nursing notes.  HISTORY  Chief Complaint  Cough   HPI Scott ChandlerSean Forrest Mcgrath is a 31 y.o. male is here with complaint of one week productive cough and right lateral rib pain that is painful with deep inspiration. Patient states there is been no injury to his ribs. He states that he has been coughing a great deal which has caused his chest to hurt. Patient denies smoking. He is uncertain of any fever but subjectively feels warm at times. Patient denies any pneumonia in the past. Currently he rates his pain as 9/10.   Past Medical History:  Diagnosis Date  . Depression     There are no active problems to display for this patient.   History reviewed. No pertinent surgical history.  Prior to Admission medications   Medication Sig Start Date End Date Taking? Authorizing Provider  albuterol (PROVENTIL HFA;VENTOLIN HFA) 108 (90 Base) MCG/ACT inhaler Inhale 2 puffs into the lungs every 6 (six) hours as needed for wheezing or shortness of breath. 12/21/16   Enid DerryWagner, Ashley, PA-C  azithromycin (ZITHROMAX Z-PAK) 250 MG tablet Take 2 tablets (500 mg) on  Day 1,  followed by 1 tablet (250 mg) once daily on Days 2 through 5. 04/11/17   Tommi RumpsSummers, Owain Eckerman L, PA-C  benzonatate (TESSALON PERLES) 100 MG capsule Take 2 capsules (200 mg total) by mouth 3 (three) times daily as needed. 04/11/17 04/11/18  Tommi RumpsSummers, Sedonia Kitner L, PA-C  ondansetron (ZOFRAN ODT) 4 MG disintegrating tablet Take 1 tablet (4 mg total) by mouth every 8 (eight) hours as needed for nausea or vomiting. 09/16/15   Devoria AlbeKnapp, Iva, MD  oxyCODONE-acetaminophen (PERCOCET/ROXICET) 5-325 MG per tablet Take 1-2 tablets by mouth every 6 (six) hours as needed for pain. 02/08/13   Donnetta Hutchingook, Brian, MD  predniSONE (DELTASONE) 20 MG tablet 3 tabs po daily x 3 days, then 2 tabs x 3 days, then  1.5 tabs x 3 days, then 1 tab x 3 days, then 0.5 tabs x 3 days 02/08/13   Donnetta Hutchingook, Brian, MD    Allergies Patient has no known allergies.  No family history on file.  Social History Social History  Substance Use Topics  . Smoking status: Never Smoker  . Smokeless tobacco: Current User    Types: Chew  . Alcohol use Yes     Comment: occasionally    Review of Systems  Constitutional: Subjectively positive for fever. Cardiovascular: Negative for chest pain. Respiratory: Negative for shortness of breath. Positive for cough. Gastrointestinal: Negative for abdominal pain, vomiting and diarrhea. Genitourinary: Negative for dysuria. Musculoskeletal: Negative for back pain. Skin: Negative for rash. Neurological: Negative for headaches, focal weakness or numbness. ____________________________________________  PHYSICAL EXAM:  VITAL SIGNS: ED Triage Vitals  Enc Vitals Group     BP 04/11/17 0512 127/73     Pulse Rate 04/11/17 0512 68     Resp 04/11/17 0512 20     Temp 04/11/17 0512 97.8 F (36.6 C)     Temp Source 04/11/17 0512 Oral     SpO2 04/11/17 0512 99 %     Weight 04/11/17 0513 180 lb (81.6 kg)     Height 04/11/17 0513 6' (1.829 m)     Head Circumference --      Peak Flow --      Pain Score 04/11/17 0513 7     Pain Loc --  Pain Edu? --      Excl. in GC? --     Constitutional: Alert and oriented. Well appearing and in no distress. Head: Normocephalic and atraumatic. Eyes: Conjunctivae are normal.  Ears: Canals clear. TMs intact bilaterally. Nose: No congestion/rhinorrhea Mouth/Throat: Mucous membranes are moist. Neck: Supple.  Hematological/Lymphatic/Immunological: No cervical lymphadenopathy. Cardiovascular: Normal rate, regular rhythm. Normal distal pulses. Respiratory: Normal respiratory effort. No wheezes/rales/rhonchi. Gastrointestinal: Soft and nontender. No distention. Musculoskeletal: Nontender with normal range of motion in all extremities.   Neurologic:  Normal gait without ataxia. Normal speech and language. No gross focal neurologic deficits are appreciated. Skin:  Skin is warm, dry and intact. No rash noted. Psychiatric: Mood and affect are normal. Patient exhibits appropriate insight and judgment.    RADIOLOGY Chest x-ray per radiologist is negative for cardiopulmonary disease. I, Tommi Rumps, personally viewed and evaluated these images (plain radiographs) as part of my medical decision making, as well as reviewing the written report by the radiologist.   INITIAL IMPRESSION / ASSESSMENT AND PLAN / ED COURSE  Patient has a history of coughing for one week. Patient was given a prescription for Tessalon Perles 1 or 2 every 8 hours as needed for cough. He is also given a prescription for Zithromax asked her elected. Patient is to follow-up with [facility]  clinic if any continued problems.    ____________________________________________  FINAL CLINICAL IMPRESSION(S) / ED DIAGNOSES  Final diagnoses:  Acute upper respiratory infection     Tommi Rumps, PA-C 04/11/17 1246    Merrily Brittle, MD 04/11/17 1306

## 2017-04-11 NOTE — ED Notes (Signed)
Pt presents with productive cough x 1 week; states this morning he has sharp pains to right ribcage upon inhalation. Pt alert & oriented with NAD noted.

## 2018-10-28 IMAGING — CT CT HEAD W/O CM
3 series · 15 of 46 positions shown, 18 images · non-contrast
Comparison: None.

CLINICAL DATA: New onset seizure.  No reported injury.

EXAM:
CT HEAD WITHOUT CONTRAST
TECHNIQUE: Contiguous axial images were obtained from the base of the skull
through the vertex without intravenous contrast.

[Series 2: head wo · axial · 0.47mm/px · z∈[-148,-28]mm · 9 of 29 slices shown, 12 images]
[im 3/29  brain]
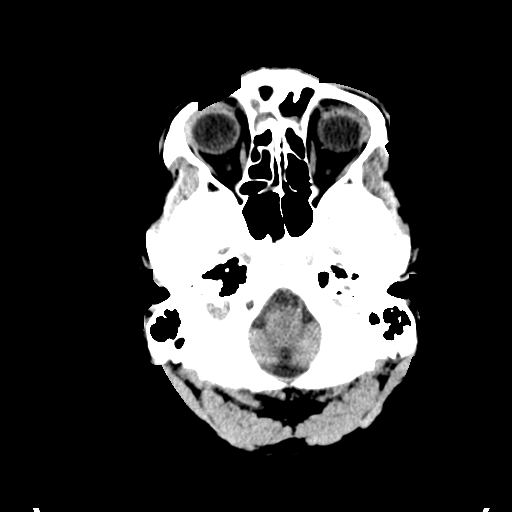
[im 3/29  bone]
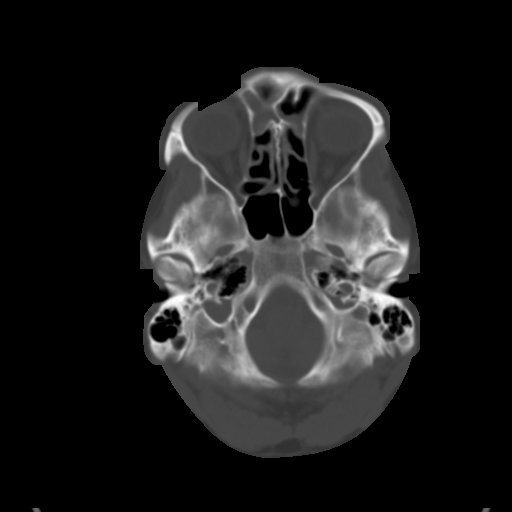
[im 6/29  brain]
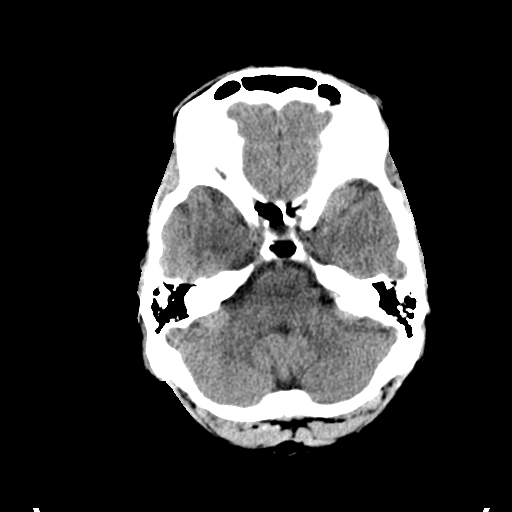
[im 9/29  brain]
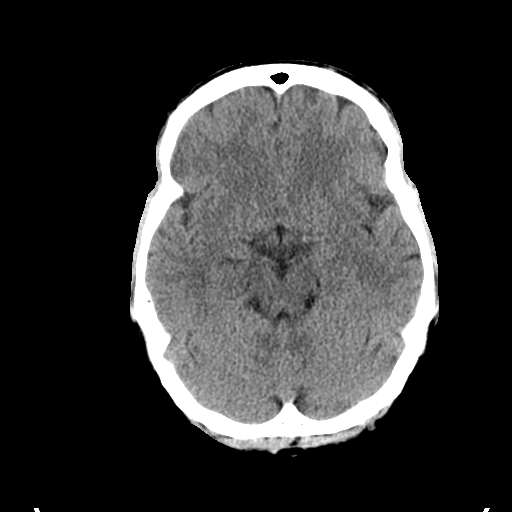
[im 12/29  brain]
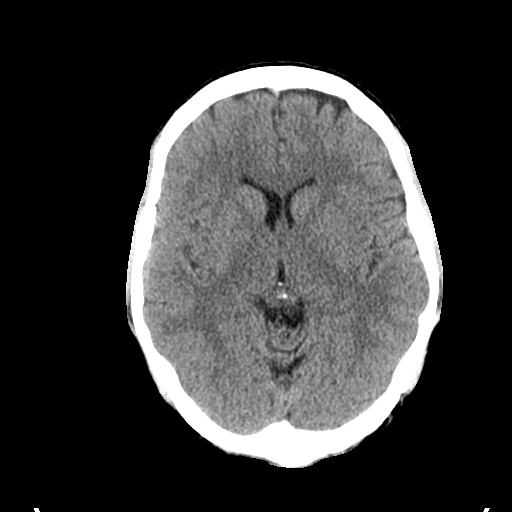
[im 15/29  brain]
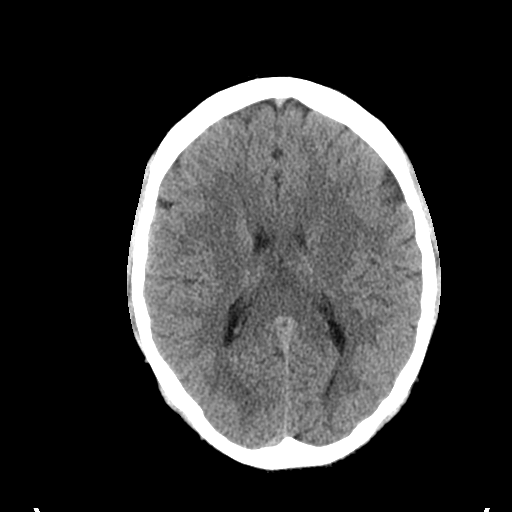
[im 15/29  bone]
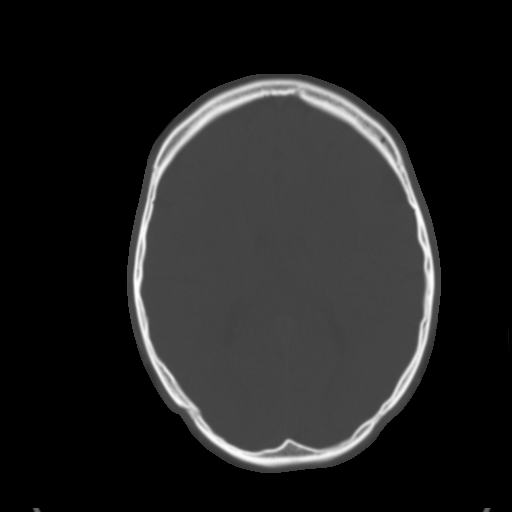
[im 18/29  brain]
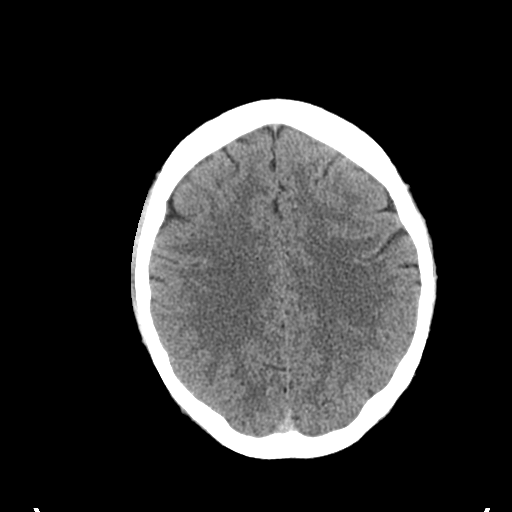
[im 21/29  brain]
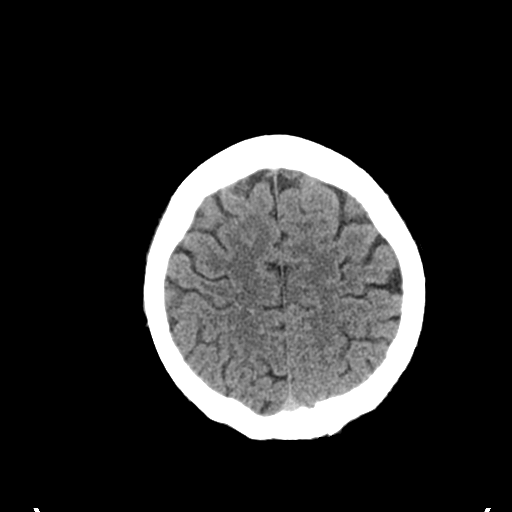
[im 24/29  brain]
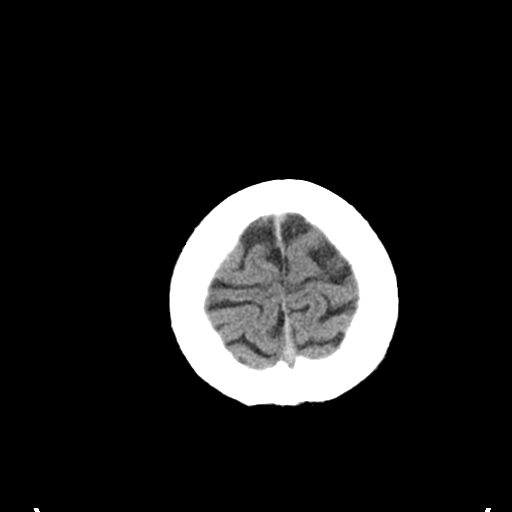
[im 27/29  brain]
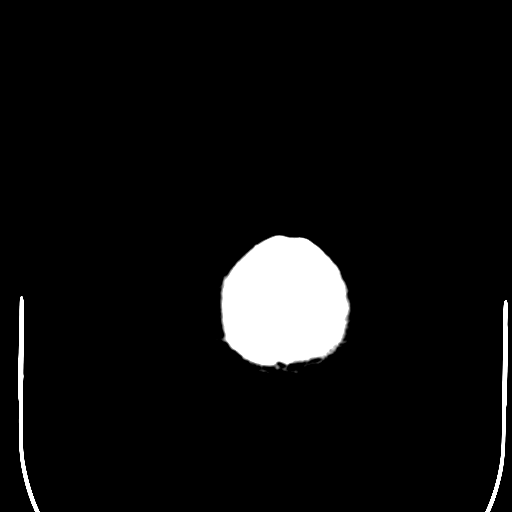
[im 27/29  bone]
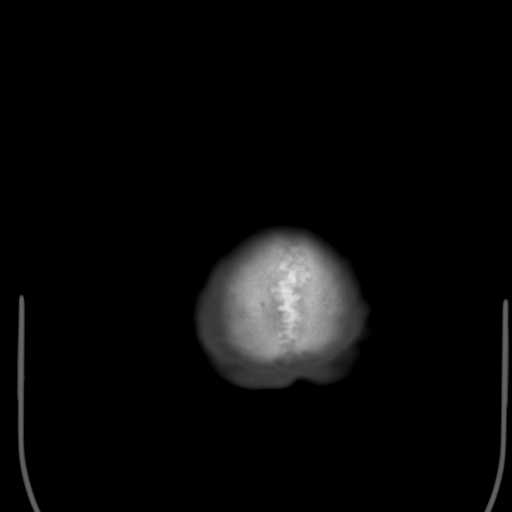

[Series 4: coronal soft tissue · coronal · 0.27mm/px · 3 of 66 slices shown]
[im 22/66  brain]
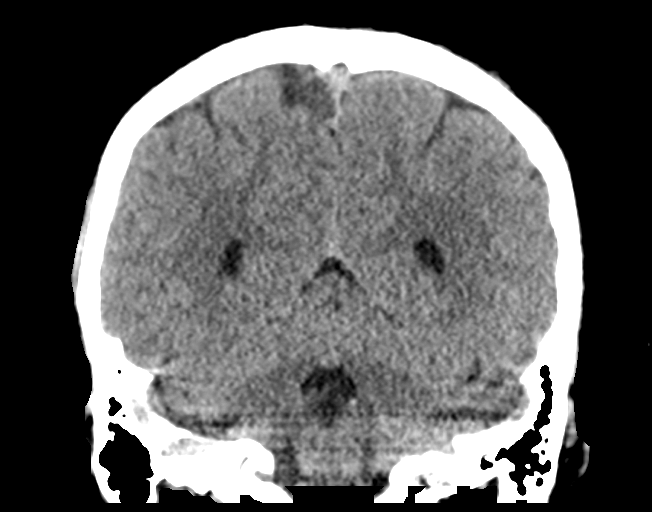
[im 29/66  brain]
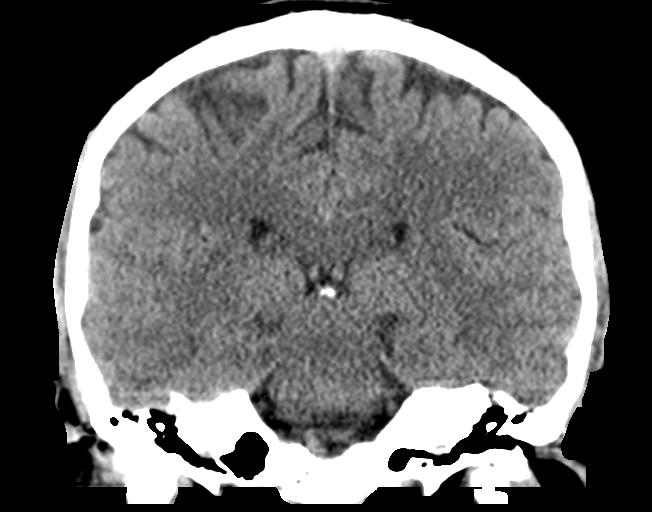
[im 37/66  brain]
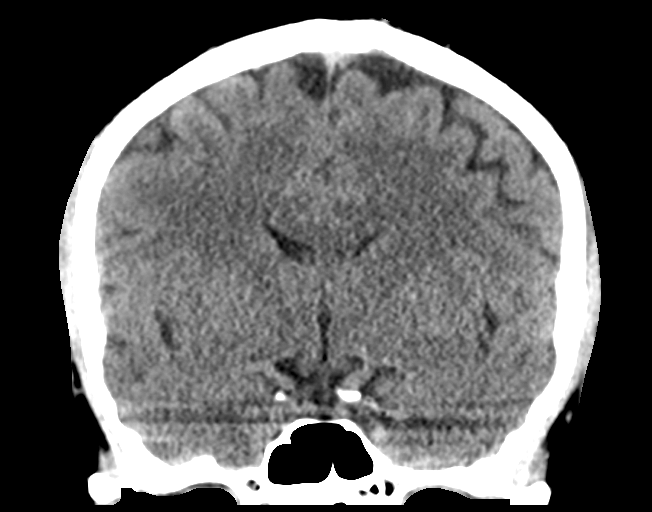

[Series 5: sagittal soft tissue · sagittal · 0.28mm/px · 3 of 55 slices shown]
[im 19/55  brain]
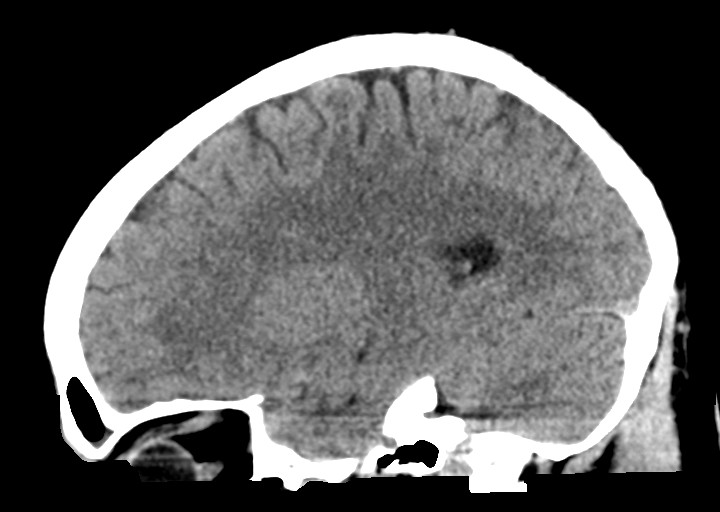
[im 28/55  brain]
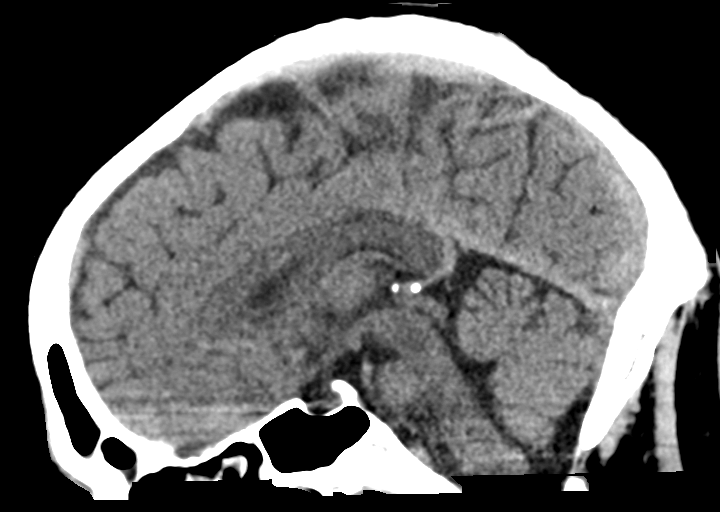
[im 37/55  brain]
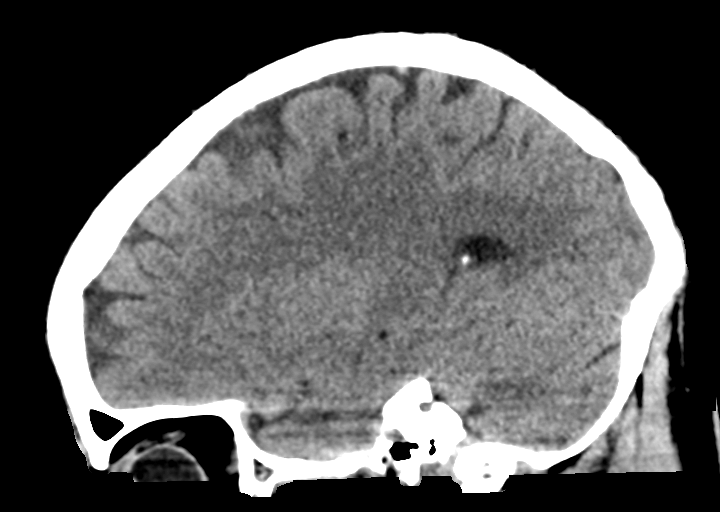

[15 of 46 positions shown; findings below may reference images not displayed]

FINDINGS: Brain: No evidence of parenchymal hemorrhage or extra-axial fluid
collection. No mass lesion, mass effect, or midline shift. No CT
evidence of acute infarction. Cerebral volume is age appropriate. No
ventriculomegaly.

Vascular: No hyperdense vessel or unexpected calcification.

Skull: No evidence of calvarial fracture.

Sinuses/Orbits: Mucoperiosteal thickening throughout the frontal
sinus and bilateral ethmoidal air cells with partial opacification
of the bilateral ethmoidal air cells. No fluid levels.

Other:  The mastoid air cells are unopacified.
IMPRESSION: 1. No evidence of acute intracranial abnormality. No evidence of
calvarial fracture.
2. Chronic appearing paranasal sinusitis.

## 2018-12-14 IMAGING — CR DG CHEST 2V
1 series · 2 of 2 positions shown · non-contrast
Comparison: Chest radiograph December 21, 2016

CLINICAL DATA: Productive cough, RIGHT lateral rib pain.

EXAM:
CHEST  2 VIEW

[Series 1: dg chest 2 view · 0.14mm/px · 2 of 2 slices shown]
[im 1/2]
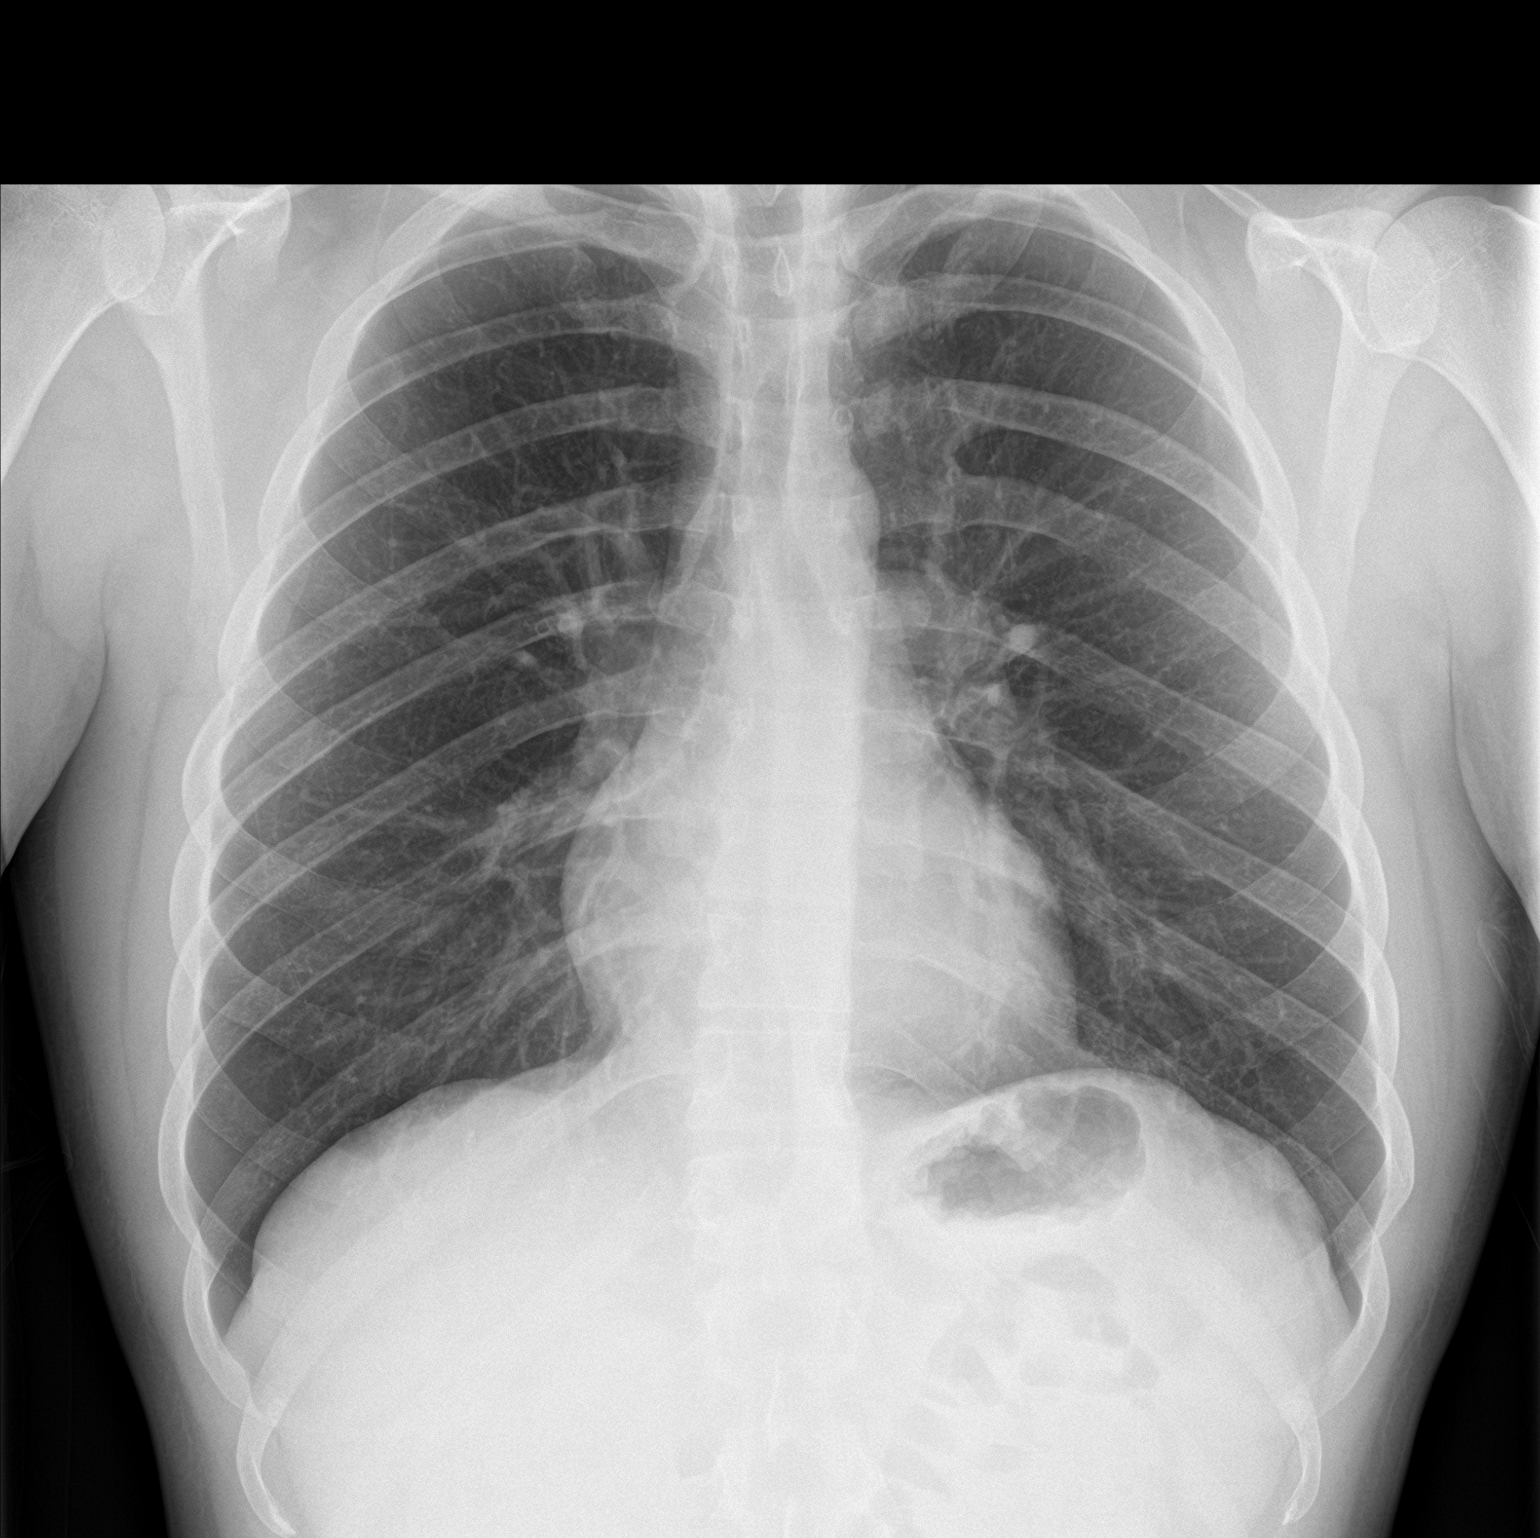
[im 2/2]
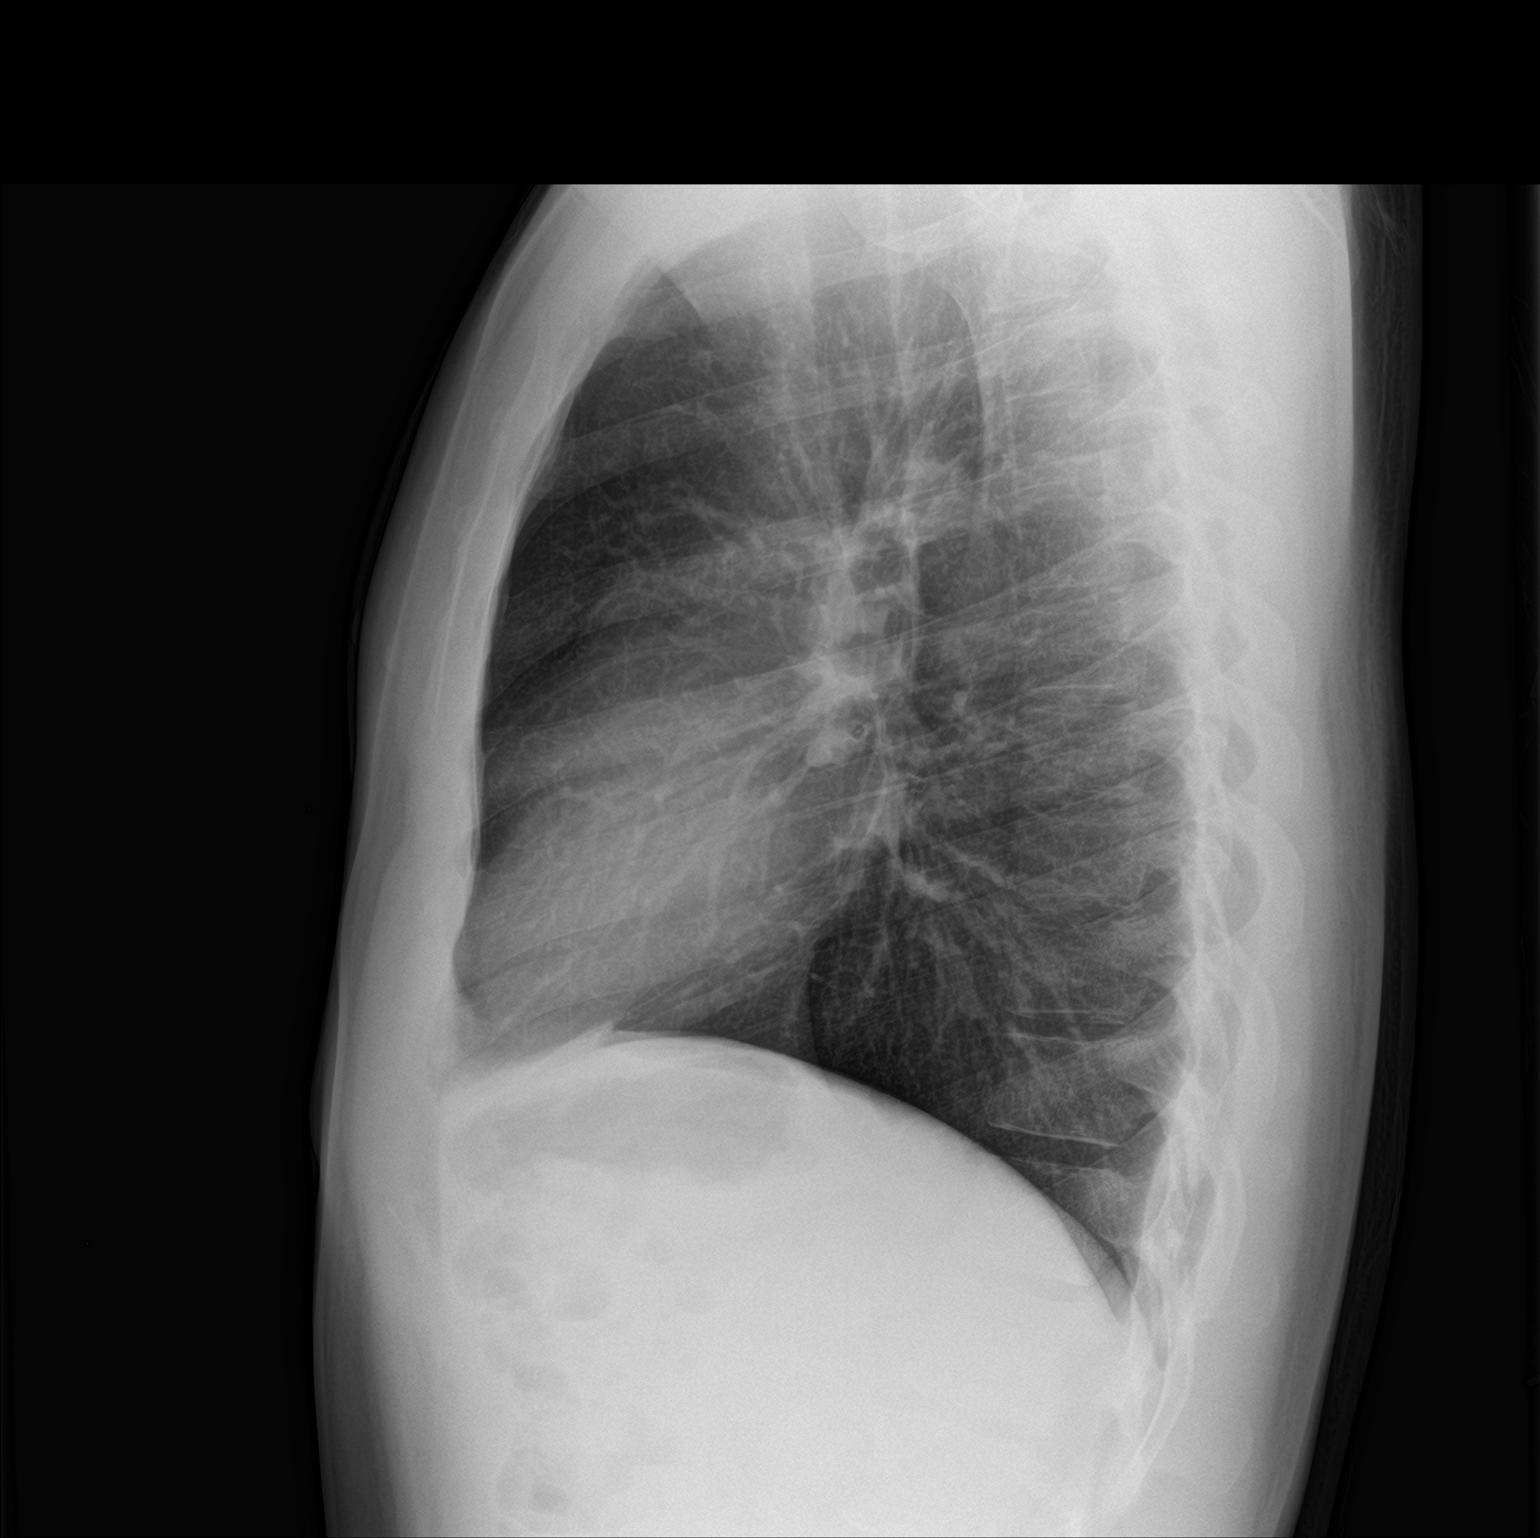

[2 of 2 positions shown; findings below may reference images not displayed]

FINDINGS: Cardiomediastinal silhouette is normal. No pleural effusions or
focal consolidations. Trachea projects midline and there is no
pneumothorax. Soft tissue planes and included osseous structures are
non-suspicious.
IMPRESSION: Normal chest.

## 2019-06-09 ENCOUNTER — Encounter: Payer: Self-pay | Admitting: Emergency Medicine

## 2019-06-09 ENCOUNTER — Emergency Department
Admission: EM | Admit: 2019-06-09 | Discharge: 2019-06-09 | Disposition: A | Discharge status: Home / Self Care | Payer: TRICARE For Life (TFL) | Attending: Emergency Medicine | Admitting: Emergency Medicine

## 2019-06-09 ENCOUNTER — Other Ambulatory Visit: Payer: Self-pay

## 2019-06-09 DIAGNOSIS — T679XXA Effect of heat and light, unspecified, initial encounter: Secondary | ICD-10-CM

## 2019-06-09 DIAGNOSIS — F17220 Nicotine dependence, chewing tobacco, uncomplicated: Secondary | ICD-10-CM | POA: Insufficient documentation

## 2019-06-09 LAB — CBC WITH DIFFERENTIAL/PLATELET
Abs Immature Granulocytes: 0.04 10*3/uL (ref 0.00–0.07)
Basophils Absolute: 0.1 10*3/uL (ref 0.0–0.1)
Basophils Relative: 0 %
Eosinophils Absolute: 0.1 10*3/uL (ref 0.0–0.5)
Eosinophils Relative: 1 %
HCT: 45.4 % (ref 39.0–52.0)
Hemoglobin: 16.1 g/dL (ref 13.0–17.0)
Immature Granulocytes: 0 %
Lymphocytes Relative: 16 %
Lymphs Abs: 2.3 10*3/uL (ref 0.7–4.0)
MCH: 31.1 pg (ref 26.0–34.0)
MCHC: 35.5 g/dL (ref 30.0–36.0)
MCV: 87.8 fL (ref 80.0–100.0)
Monocytes Absolute: 0.7 10*3/uL (ref 0.1–1.0)
Monocytes Relative: 5 %
Neutro Abs: 11.1 10*3/uL — ABNORMAL HIGH (ref 1.7–7.7)
Neutrophils Relative %: 78 %
Platelets: 232 10*3/uL (ref 150–400)
RBC: 5.17 MIL/uL (ref 4.22–5.81)
RDW: 12.2 % (ref 11.5–15.5)
WBC: 14.3 10*3/uL — ABNORMAL HIGH (ref 4.0–10.5)
nRBC: 0 % (ref 0.0–0.2)

## 2019-06-09 LAB — BASIC METABOLIC PANEL
Anion gap: 10 (ref 5–15)
BUN: 21 mg/dL — ABNORMAL HIGH (ref 6–20)
CO2: 24 mmol/L (ref 22–32)
Calcium: 9.3 mg/dL (ref 8.9–10.3)
Chloride: 109 mmol/L (ref 98–111)
Creatinine, Ser: 1.16 mg/dL (ref 0.61–1.24)
GFR calc Af Amer: >60 mL/min (ref >60)
GFR calc non Af Amer: >60 mL/min (ref >60)
Glucose, Bld: 108 mg/dL — ABNORMAL HIGH (ref 70–99)
Potassium: 3.9 mmol/L (ref 3.5–5.1)
Sodium: 143 mmol/L (ref 135–145)

## 2019-06-09 MED ORDER — SODIUM CHLORIDE 0.9 % IV BOLUS
1000.0000 mL | Freq: Once | INTRAVENOUS | Status: AC
  Administered 2019-06-09: 1000 mL via INTRAVENOUS

## 2019-06-09 NOTE — ED Notes (Signed)
Recollect of the green top sent to lab

## 2019-06-09 NOTE — ED Triage Notes (Addendum)
Patient to ED via [facility]. 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 Patient reports feeling overheated so went in to take a shower. Reports he noticed he had a headache and his arms and legs felt heavy, which has happened in the past with heat exposure. EMS reports when they got to patient he was pale and diaphoretic. States he also had nausea and vomiting. Given 1 liter of NS and 4 mg of zofran by EMS prior to arrival.

## 2019-06-09 NOTE — ED Provider Notes (Signed)
[facility] Emergency Department Provider Note  ____________________________________________   I have reviewed the triage vital signs and the nursing notes.   HISTORY  Chief Complaint Heat Exposure   History limited by: Not Limited   HPI Scott Mcgrath is a 33 y.o. male who presents to the emergency department today via EMS because of concern for heat exposure. 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 The patient states that he had tried keeping up with his hydration. He did start feeling lightheaded and nauseas. He states he has had heat exposure in the past and that this felt similar to his previous episode. Denies any recent illness.   Records reviewed. Per medical record review patient has a history of depression.  Past Medical History:  Diagnosis Date  . Depression     There are no active problems to display for this patient.   History reviewed. No pertinent surgical history.  Prior to Admission medications   Medication Sig Start Date End Date Taking? Authorizing Provider  albuterol (PROVENTIL HFA;VENTOLIN HFA) 108 (90 Base) MCG/ACT inhaler Inhale 2 puffs into the lungs every 6 (six) hours as needed for wheezing or shortness of breath. 12/21/16   Laban Emperor, PA-C  azithromycin (ZITHROMAX Z-PAK) 250 MG tablet Take 2 tablets (500 mg) on  Day 1,  followed by 1 tablet (250 mg) once daily on Days 2 through 5. 04/11/17   Letitia Neri L, PA-C  ondansetron (ZOFRAN ODT) 4 MG disintegrating tablet Take 1 tablet (4 mg total) by mouth every 8 (eight) hours as needed for nausea or vomiting. 09/16/15   Rolland Porter, MD  oxyCODONE-acetaminophen (PERCOCET/ROXICET) 5-325 MG per tablet Take 1-2 tablets by mouth every 6 (six) hours as needed for pain. 02/08/13   Nat Christen, MD  predniSONE (DELTASONE) 20 MG tablet 3 tabs po daily x 3 days, then 2 tabs x 3 days, then 1.5 tabs x 3 days, then 1 tab x 3 days, then 0.5 tabs x 3  days 02/08/13   Nat Christen, MD    Allergies Patient has no known allergies.  No family history on file.  Social History Social History   Tobacco Use  . Smoking status: Never Smoker  . Smokeless tobacco: Current User    Types: Chew  Substance Use Topics  . Alcohol use: Not Currently    Comment: occasionally  . Drug use: Yes    Types: Marijuana    Comment: "every once in a while"    Review of Systems Constitutional: No fever/chills Eyes: No visual changes. ENT: No sore throat. Cardiovascular: Denies chest pain. Respiratory: Denies shortness of breath. Gastrointestinal: No abdominal pain.  Positive for nausea and vomiting.  Genitourinary: Negative for dysuria. Musculoskeletal: Negative for back pain. Skin: Negative for rash. Neurological: Negative for headaches, focal weakness or numbness.  ____________________________________________   PHYSICAL EXAM:  VITAL SIGNS: ED Triage Vitals  Enc Vitals Group     BP 06/09/19 1831 114/74     Pulse Rate 06/09/19 1831 65     Resp 06/09/19 1831 16     Temp 06/09/19 1831 97.7 F (36.5 C)     Temp Source 06/09/19 1831 Oral     SpO2 06/09/19 1831 100 %     Weight 06/09/19 1829 180 lb (81.6 kg)     Height 06/09/19 1829 6' (1.829 m)     Head Circumference --      Peak Flow --  Pain Score 06/09/19 1829 6   Constitutional: Alert and oriented.  Eyes: Conjunctivae are normal.  ENT      Head: Normocephalic and atraumatic.      Nose: No congestion/rhinnorhea.      Mouth/Throat: Mucous membranes are moist.      Neck: No stridor. Hematological/Lymphatic/Immunilogical: No cervical lymphadenopathy. Cardiovascular: Normal rate, regular rhythm.  No murmurs, rubs, or gallops.  Respiratory: Normal respiratory effort without tachypnea nor retractions. Breath sounds are clear and equal bilaterally. No wheezes/rales/rhonchi. Gastrointestinal: Soft and non tender. No rebound. No guarding.  Genitourinary: Deferred Musculoskeletal:  Normal range of motion in all extremities. No lower extremity edema. Neurologic:  Normal speech and language. No gross focal neurologic deficits are appreciated.  Skin:  Skin is warm, dry and intact. No rash noted. Psychiatric: Mood and affect are normal. Speech and behavior are normal. Patient exhibits appropriate insight and judgment.  ____________________________________________    LABS (pertinent positives/negatives)  CBC wbc 14.3, hgb 16.1, plt 232 BMP wnl except glu 108, BUN 21  ____________________________________________   EKG  I, Phineas SemenGraydon Maynor Mwangi, attending physician, personally viewed and interpreted this EKG  EKG Time: 1834 Rate: 75 Rhythm: sinus rhythm Axis: normal Intervals: qtc 425 QRS: nonspecific intraventricular conduction delay ST changes: no st elevation Impression: abnormal ekg  I, Phineas SemenGraydon Raihan Kimmel, attending physician, personally viewed and interpreted this EKG  EKG Time: 1936 Rate: 67 Rhythm: sinus rhythm Axis: normal Intervals: qtc 412 QRS: nonspecific intraventricular conduction delay ST changes: no st elevation Impression: abnormal ekg ____________________________________________    RADIOLOGY  None  ____________________________________________   PROCEDURES  Procedures  ____________________________________________   INITIAL IMPRESSION / ASSESSMENT AND PLAN / ED COURSE  Pertinent labs & imaging results that were available during my care of the patient were reviewed by me and considered in my medical decision making (see chart for details).   Patient presented to the emergency department today because of concerns for nausea vomiting and possible heat exhaustion.  Was working outside.  Patient's blood work here without concerning findings.  He did feel better after IV fluids.  Discussed importance of hydration.  ____________________________________________   FINAL CLINICAL IMPRESSION(S) / ED DIAGNOSES  Final diagnoses:  Heat  exposure, initial encounter     Note: This dictation was prepared with Dragon dictation. Any transcriptional errors that result from this process are unintentional     Phineas SemenGoodman, Wissam Resor, MD 06/09/19 2317

## 2019-06-09 NOTE — Discharge Instructions (Addendum)
Please seek medical attention for any high fevers, chest pain, shortness of breath, change in behavior, persistent vomiting, bloody stool or any other new or concerning symptoms.  

## 2019-10-06 ENCOUNTER — Other Ambulatory Visit: Payer: Self-pay | Admitting: *Deleted

## 2019-10-06 DIAGNOSIS — Z20822 Contact with and (suspected) exposure to covid-19: Secondary | ICD-10-CM

## 2019-10-07 LAB — NOVEL CORONAVIRUS, NAA: SARS-CoV-2, NAA: NOT DETECTED

## 2020-05-10 ENCOUNTER — Telehealth: Payer: Self-pay | Admitting: General Practice

## 2020-05-10 NOTE — Telephone Encounter (Signed)
Individual has been contacted 3+ times regarding ED referral and has been given information regarding how to become a pt. No further attempts will be made to contact individual. 

## 2023-03-15 ENCOUNTER — Telehealth: Payer: Self-pay | Admitting: Internal Medicine

## 2023-03-15 NOTE — Telephone Encounter (Signed)
Lvm to schedule pcp appointment per [name]

## 2023-03-28 ENCOUNTER — Ambulatory Visit (INDEPENDENT_AMBULATORY_CARE_PROVIDER_SITE_OTHER): Payer: No Typology Code available for payment source | Admitting: Nurse Practitioner

## 2023-03-28 ENCOUNTER — Encounter: Payer: Self-pay | Admitting: Nurse Practitioner

## 2023-03-28 VITALS — BP 120/84 | HR 106 | Temp 98.4°F | Resp 16 | Ht 72.0 in | Wt 202.0 lb

## 2023-03-28 DIAGNOSIS — M25512 Pain in left shoulder: Secondary | ICD-10-CM

## 2023-03-28 DIAGNOSIS — M545 Low back pain, unspecified: Secondary | ICD-10-CM | POA: Diagnosis not present

## 2023-03-28 DIAGNOSIS — G8929 Other chronic pain: Secondary | ICD-10-CM

## 2023-03-28 DIAGNOSIS — E782 Mixed hyperlipidemia: Secondary | ICD-10-CM | POA: Diagnosis not present

## 2023-03-28 DIAGNOSIS — F901 Attention-deficit hyperactivity disorder, predominantly hyperactive type: Secondary | ICD-10-CM

## 2023-03-28 MED ORDER — AMPHETAMINE-DEXTROAMPHET ER 15 MG PO CP24: 15.0000 mg | ORAL_CAPSULE | ORAL | 0 refills | Status: DC

## 2023-03-28 NOTE — Progress Notes (Signed)
[facility] [address] [place], [place] [address]  Internal MEDICINE  Office Visit Note  Patient Name: Scott Mcgrath  [id number]  [id number]  Date of Service: 03/28/2023   Complaints/HPI Pt is here for establishment of PCP. Chief Complaint  Patient presents with   New Patient (Initial Visit)    Discuss adhd    HPI Scott Mcgrath presents for a new patient visit to establish care.  Well-appearing 37 y.o. male with ADHD  Work: Psychologist, occupational  Home: live with kids  Diet: fair  Exercise: running a couple times a week, helps son with football.  Tobacco use: chew  Alcohol use: a couple drinks a week.  Illicit drug use: was smoking weed, now switched to delta8.  Labs: due for routine labs  New or worsening pain: chronic pain, left shoulder injury 8 months ago. Knee issues since the [employer]. Back pain x 6 months  --ADHD -- diagnosed at age 56. Stopped taking medications at age 80. Has been on ritalin and then adderall.  --back pain -- 6/10 in the morning, improves throughout the day, has not had any imaging done or any further evaluation.   Current Medication: Outpatient Encounter Medications as of 03/28/2023  Medication Sig   amphetamine-dextroamphetamine (ADDERALL XR) 15 MG 24 hr capsule Take 1 capsule by mouth every morning.   [DISCONTINUED] albuterol (PROVENTIL HFA;VENTOLIN HFA) 108 (90 Base) MCG/ACT inhaler Inhale 2 puffs into the lungs every 6 (six) hours as needed for wheezing or shortness of breath. (Patient not taking: Reported on 03/28/2023)   [DISCONTINUED] azithromycin (ZITHROMAX Z-PAK) 250 MG tablet Take 2 tablets (500 mg) on  Day 1,  followed by 1 tablet (250 mg) once daily on Days 2 through 5. (Patient not taking: Reported on 03/28/2023)   [DISCONTINUED] ondansetron (ZOFRAN ODT) 4 MG disintegrating tablet Take 1 tablet (4 mg total) by mouth every 8 (eight) hours as needed for nausea or vomiting. (Patient not taking: Reported on 03/28/2023)   [DISCONTINUED]  oxyCODONE-acetaminophen (PERCOCET/ROXICET) 5-325 MG per tablet Take 1-2 tablets by mouth every 6 (six) hours as needed for pain. (Patient not taking: Reported on 03/28/2023)   [DISCONTINUED] predniSONE (DELTASONE) 20 MG tablet 3 tabs po daily x 3 days, then 2 tabs x 3 days, then 1.5 tabs x 3 days, then 1 tab x 3 days, then 0.5 tabs x 3 days (Patient not taking: Reported on 03/28/2023)   No facility-administered encounter medications on file as of 03/28/2023.    Surgical History: History reviewed. No pertinent surgical history.  Medical History: Past Medical History:  Diagnosis Date   Depression     Family History: Family History  Problem Relation Age of Onset   Diabetes Mother    ADD / ADHD Brother    Asthma Brother     Social History   Socioeconomic History   Marital status: Single    Spouse name: Not on file   Number of children: Not on file   Years of education: Not on file   Highest education level: Not on file  Occupational History   Not on file  Tobacco Use   Smoking status: Never   Smokeless tobacco: Current    Types: Chew  Substance and Sexual Activity   Alcohol use: Yes    Comment: occasionally   Drug use: Yes    Types: Marijuana    Comment: "every once in a while"   Sexual activity: Not Currently  Other Topics Concern   Not on file  Social History Narrative  Not on file   Social Determinants of Health   Financial Resource Strain: Not on file  Food Insecurity: Not on file  Transportation Needs: Not on file  Physical Activity: Not on file  Stress: Not on file  Social Connections: Not on file  Intimate Partner Violence: Not on file     Review of Systems  Constitutional:  Negative for chills, fatigue and unexpected weight change.  HENT:  Negative for congestion, rhinorrhea, sneezing and sore throat.   Respiratory: Negative.  Negative for cough, chest tightness, shortness of breath and wheezing.   Cardiovascular: Negative.  Negative for chest pain  and palpitations.  Gastrointestinal:  Negative for abdominal pain, constipation, diarrhea, nausea and vomiting.  Musculoskeletal:  Positive for arthralgias and back pain. Negative for joint swelling and neck pain.  Skin:  Negative for rash.  Neurological: Negative.   Psychiatric/Behavioral: Negative.  Negative for behavioral problems (Depression), sleep disturbance and suicidal ideas. The patient is not nervous/anxious.     Vital Signs: BP 120/84   Pulse (!) 106   Temp 98.4 F (36.9 C)   Resp 16   Ht 6' (1.829 m)   Wt 202 lb (91.6 kg)   SpO2 96%   BMI 27.40 kg/m    Physical Exam Vitals reviewed.  Constitutional:      General: He is not in acute distress.    Appearance: Normal appearance. He is not ill-appearing.  HENT:     Head: Normocephalic and atraumatic.  Eyes:     Pupils: Pupils are equal, round, and reactive to light.  Cardiovascular:     Rate and Rhythm: Normal rate and regular rhythm.  Pulmonary:     Effort: Pulmonary effort is normal. No respiratory distress.  Neurological:     Mental Status: He is alert and oriented to person, place, and time.  Psychiatric:        Mood and Affect: Mood normal.        Behavior: Behavior normal.       Assessment/Plan: 1. Chronic midline low back pain without sciatica Xray ordered for further evaluation, pain is tolerable with OTC meds right now - DG Lumbar Spine Complete; Future  2. Chronic left shoulder pain Manageable with OTC pain meds for now   3. Mixed hyperlipidemia Routine labs ordered  - CBC with Differential/Platelet - CMP14+EGFR - Lipid Profile  4. ADHD (attention deficit hyperactivity disorder), predominantly hyperactive impulsive type Start adderall XR as prescribed. Follow up in 4 weeks. Patient found out that his insurance says that anything prescribed more than 14 days has to be fill at the [facility] pharmacy. Instructed patient to call his insurance company and clarify this because I cannot prescribed  medications to the [place] pharmacy. [facility].     General Counseling: Scott Mcgrath verbalizes understanding of the findings of todays visit and agrees with plan of treatment. I have discussed any further diagnostic evaluation that may be needed or ordered today. We also reviewed his medications today. he has been encouraged to call the office with any questions or concerns that should arise related to todays visit.    Orders Placed This Encounter  Procedures   DG Lumbar Spine Complete   CBC with Differential/Platelet   CMP14+EGFR   Lipid Profile    Meds ordered this encounter  Medications   amphetamine-dextroamphetamine (ADDERALL XR) 15 MG 24 hr capsule    Sig: Take 1 capsule by mouth every morning.    Dispense:  30 capsule  Refill:  0    Please fill new script today.    Return in about 4 weeks (around 04/25/2023) for annual physical plus ADHD check, labs and imaging with Justiss Gerbino PCP. Marland Kitchen  Time spent:30 Minutes Time spent with patient included reviewing progress notes, labs, imaging studies, and discussing plan for follow up.   [place] Controlled Substance Database was reviewed by me for overdose risk score (ORS)   This patient was seen by Sallyanne Kuster, FNP-C in collaboration with Dr. Beverely Risen as a part of collaborative care agreement.   Jayce Kainz R. Tedd Sias, MSN, FNP-C Internal Medicine

## 2023-03-29 ENCOUNTER — Other Ambulatory Visit: Payer: Self-pay

## 2023-03-29 ENCOUNTER — Other Ambulatory Visit: Payer: Self-pay | Admitting: Nurse Practitioner

## 2023-03-29 ENCOUNTER — Telehealth: Payer: Self-pay | Admitting: Nurse Practitioner

## 2023-03-29 ENCOUNTER — Encounter: Payer: Self-pay | Admitting: Nurse Practitioner

## 2023-03-29 ENCOUNTER — Telehealth: Payer: Self-pay

## 2023-03-29 MED ORDER — AMPHETAMINE-DEXTROAMPHET ER 15 MG PO CP24: 15.0000 mg | ORAL_CAPSULE | ORAL | 0 refills | Status: DC

## 2023-03-29 NOTE — Telephone Encounter (Signed)
done

## 2023-03-29 NOTE — Telephone Encounter (Signed)
Spoke with pharmacy staff at the [facility] clinic pharmacy. I was informed that if a patient is referred out to me and they have [facility] then I am allowed to prescribe to a [place] pharmacy for that patient only.

## 2023-04-19 LAB — CBC WITH DIFFERENTIAL/PLATELET
Basophils Absolute: 0 10*3/uL (ref 0.0–0.2)
Basos: 0 %
EOS (ABSOLUTE): 0.1 10*3/uL (ref 0.0–0.4)
Eos: 1 %
Hematocrit: 46.2 % (ref 37.5–51.0)
Hemoglobin: 15.9 g/dL (ref 13.0–17.7)
Immature Grans (Abs): 0 10*3/uL (ref 0.0–0.1)
Immature Granulocytes: 0 %
Lymphocytes Absolute: 2.1 10*3/uL (ref 0.7–3.1)
Lymphs: 25 %
MCH: 29.9 pg (ref 26.6–33.0)
MCHC: 34.4 g/dL (ref 31.5–35.7)
MCV: 87 fL (ref 79–97)
Monocytes Absolute: 0.5 10*3/uL (ref 0.1–0.9)
Monocytes: 6 %
Neutrophils Absolute: 5.5 10*3/uL (ref 1.4–7.0)
Neutrophils: 68 %
Platelets: 258 10*3/uL (ref 150–450)
RBC: 5.32 x10E6/uL (ref 4.14–5.80)
RDW: 11.8 % (ref 11.6–15.4)
WBC: 8.3 10*3/uL (ref 3.4–10.8)

## 2023-04-19 LAB — LIPID PANEL
Chol/HDL Ratio: 3.4 ratio (ref 0.0–5.0)
Cholesterol, Total: 150 mg/dL (ref 100–199)
HDL: 44 mg/dL (ref >39)
LDL Chol Calc (NIH): 90 mg/dL (ref 0–99)
Triglycerides: 83 mg/dL (ref 0–149)
VLDL Cholesterol Cal: 16 mg/dL (ref 5–40)

## 2023-04-19 LAB — CMP14+EGFR
ALT: 23 IU/L (ref 0–44)
AST: 21 IU/L (ref 0–40)
Albumin/Globulin Ratio: 2.2 (ref 1.2–2.2)
Albumin: 5 g/dL (ref 4.1–5.1)
Alkaline Phosphatase: 54 IU/L (ref 44–121)
BUN/Creatinine Ratio: 15 (ref 9–20)
BUN: 17 mg/dL (ref 6–20)
Bilirubin Total: 0.7 mg/dL (ref 0.0–1.2)
CO2: 24 mmol/L (ref 20–29)
Calcium: 9.3 mg/dL (ref 8.7–10.2)
Chloride: 105 mmol/L (ref 96–106)
Creatinine, Ser: 1.1 mg/dL (ref 0.76–1.27)
Globulin, Total: 2.3 g/dL (ref 1.5–4.5)
Glucose: 103 mg/dL — ABNORMAL HIGH (ref 70–99)
Potassium: 4.2 mmol/L (ref 3.5–5.2)
Sodium: 143 mmol/L (ref 134–144)
Total Protein: 7.3 g/dL (ref 6.0–8.5)
eGFR: 89 mL/min/{1.73_m2} (ref >59)

## 2023-04-24 ENCOUNTER — Ambulatory Visit (INDEPENDENT_AMBULATORY_CARE_PROVIDER_SITE_OTHER): Payer: No Typology Code available for payment source | Admitting: Nurse Practitioner

## 2023-04-24 ENCOUNTER — Encounter: Payer: Self-pay | Admitting: Nurse Practitioner

## 2023-04-24 VITALS — BP 130/86 | HR 96 | Temp 98.3°F | Resp 16 | Ht 72.0 in | Wt 199.6 lb

## 2023-04-24 DIAGNOSIS — F901 Attention-deficit hyperactivity disorder, predominantly hyperactive type: Secondary | ICD-10-CM

## 2023-04-24 DIAGNOSIS — Z0001 Encounter for general adult medical examination with abnormal findings: Secondary | ICD-10-CM

## 2023-04-24 DIAGNOSIS — M545 Low back pain, unspecified: Secondary | ICD-10-CM | POA: Diagnosis not present

## 2023-04-24 DIAGNOSIS — G8929 Other chronic pain: Secondary | ICD-10-CM

## 2023-04-24 DIAGNOSIS — Z23 Encounter for immunization: Secondary | ICD-10-CM

## 2023-04-24 MED ORDER — AMPHETAMINE-DEXTROAMPHETAMINE 10 MG PO TABS
10.0000 mg | ORAL_TABLET | Freq: Every day | ORAL | 0 refills | Status: DC
Start: 2023-04-24 — End: 2023-07-24

## 2023-04-24 MED ORDER — AMPHETAMINE-DEXTROAMPHET ER 15 MG PO CP24
15.0000 mg | ORAL_CAPSULE | ORAL | 0 refills | Status: DC
Start: 2023-06-19 — End: 2023-07-24

## 2023-04-24 MED ORDER — AMPHETAMINE-DEXTROAMPHETAMINE 10 MG PO TABS
10.0000 mg | ORAL_TABLET | Freq: Every day | ORAL | 0 refills | Status: DC
Start: 2023-05-22 — End: 2023-07-24

## 2023-04-24 MED ORDER — TETANUS-DIPHTH-ACELL PERTUSSIS 5-2.5-18.5 LF-MCG/0.5 IM SUSP
0.5000 mL | Freq: Once | INTRAMUSCULAR | 0 refills | Status: AC
Start: 2023-04-24 — End: 2023-04-24

## 2023-04-24 MED ORDER — AMPHETAMINE-DEXTROAMPHETAMINE 10 MG PO TABS
10.0000 mg | ORAL_TABLET | Freq: Every day | ORAL | 0 refills | Status: DC
Start: 2023-06-19 — End: 2023-07-24

## 2023-04-24 MED ORDER — AMPHETAMINE-DEXTROAMPHET ER 15 MG PO CP24: 15.0000 mg | ORAL_CAPSULE | ORAL | 0 refills | Status: DC

## 2023-04-24 MED ORDER — AMPHETAMINE-DEXTROAMPHET ER 15 MG PO CP24
15.0000 mg | ORAL_CAPSULE | ORAL | 0 refills | Status: DC
Start: 2023-05-22 — End: 2023-07-24

## 2023-04-24 MED ORDER — AMPHETAMINE-DEXTROAMPHET ER 15 MG PO CP24
15.0000 mg | ORAL_CAPSULE | ORAL | 0 refills | Status: DC
Start: 2023-04-24 — End: 2023-05-07

## 2023-04-24 NOTE — Progress Notes (Signed)
[facility] [address] [place], [place] [address]  Internal MEDICINE  Office Visit Note  Patient Name: Scott Mcgrath  [id number]  [id number]  Date of Service: 04/24/2023  Chief Complaint  Patient presents with   Depression   Annual Exam    HPI Scott Mcgrath presents for an annual well visit and physical exam.  Well-appearing 37 y.o. male with ADHD and chronic back pain.  Labs: lab results discussed  New or worsening pain: none new, chronic pain unchanged  Other concerns: chronic back pain, still needs to get lumbar xray done Adderall helping but seems to wear off around noon    Current Medication: Outpatient Encounter Medications as of 04/24/2023  Medication Sig   amphetamine-dextroamphetamine (ADDERALL) 10 MG tablet Take 1 tablet (10 mg total) by mouth daily at 12 noon.   [START ON 05/22/2023] amphetamine-dextroamphetamine (ADDERALL) 10 MG tablet Take 1 tablet (10 mg total) by mouth daily at 12 noon.   [START ON 06/19/2023] amphetamine-dextroamphetamine (ADDERALL) 10 MG tablet Take 1 tablet (10 mg total) by mouth daily at 12 noon.   [DISCONTINUED] amphetamine-dextroamphetamine (ADDERALL XR) 15 MG 24 hr capsule Take 1 capsule by mouth every morning.   [DISCONTINUED] Tdap (BOOSTRIX) 5-2.5-18.5 LF-MCG/0.5 injection Inject 0.5 mLs into the muscle once.   amphetamine-dextroamphetamine (ADDERALL XR) 15 MG 24 hr capsule Take 1 capsule by mouth every morning.   amphetamine-dextroamphetamine (ADDERALL XR) 15 MG 24 hr capsule Take 1 capsule by mouth every morning.   [START ON 05/22/2023] amphetamine-dextroamphetamine (ADDERALL XR) 15 MG 24 hr capsule Take 1 capsule by mouth every morning.   [START ON 06/19/2023] amphetamine-dextroamphetamine (ADDERALL XR) 15 MG 24 hr capsule Take 1 capsule by mouth every morning.   Tdap (BOOSTRIX) 5-2.5-18.5 LF-MCG/0.5 injection Inject 0.5 mLs into the muscle once for 1 dose.   No facility-administered encounter medications on file as of 04/24/2023.     Surgical History: History reviewed. No pertinent surgical history.  Medical History: Past Medical History:  Diagnosis Date   Depression     Family History: Family History  Problem Relation Age of Onset   Diabetes Mother    ADD / ADHD Brother    Asthma Brother     Social History   Socioeconomic History   Marital status: Single    Spouse name: Not on file   Number of children: Not on file   Years of education: Not on file   Highest education level: Not on file  Occupational History   Not on file  Tobacco Use   Smoking status: Never   Smokeless tobacco: Current    Types: Chew  Substance and Sexual Activity   Alcohol use: Yes    Comment: occasionally   Drug use: Yes    Types: Marijuana    Comment: "every once in a while"   Sexual activity: Not Currently  Other Topics Concern   Not on file  Social History Narrative   Not on file   Social Determinants of Health   Financial Resource Strain: Not on file  Food Insecurity: Not on file  Transportation Needs: Not on file  Physical Activity: Not on file  Stress: Not on file  Social Connections: Not on file  Intimate Partner Violence: Not on file      Review of Systems  Constitutional:  Negative for activity change, appetite change, chills, fatigue, fever and unexpected weight change.  HENT: Negative.  Negative for congestion, ear pain, rhinorrhea, sneezing, sore throat and trouble swallowing.   Eyes: Negative.  Respiratory: Negative.  Negative for cough, chest tightness, shortness of breath and wheezing.   Cardiovascular: Negative.  Negative for chest pain and palpitations.  Gastrointestinal: Negative.  Negative for abdominal pain, blood in stool, constipation, diarrhea, nausea and vomiting.  Endocrine: Negative.   Genitourinary: Negative.  Negative for difficulty urinating, dysuria, frequency, hematuria and urgency.  Musculoskeletal:  Positive for arthralgias and back pain. Negative for joint swelling,  myalgias and neck pain.  Skin: Negative.  Negative for rash and wound.  Allergic/Immunologic: Negative.  Negative for immunocompromised state.  Neurological: Negative.  Negative for dizziness, seizures, numbness and headaches.  Hematological: Negative.   Psychiatric/Behavioral: Negative.  Negative for behavioral problems (Depression), self-injury, sleep disturbance and suicidal ideas. The patient is not nervous/anxious.     Vital Signs: BP 130/86   Pulse 96   Temp 98.3 F (36.8 C)   Resp 16   Ht 6' (1.829 m)   Wt 199 lb 9.6 oz (90.5 kg)   SpO2 95%   BMI 27.07 kg/m    Physical Exam Vitals reviewed.  Constitutional:      General: He is not in acute distress.    Appearance: Normal appearance. He is well-developed, well-groomed and overweight. He is not ill-appearing or diaphoretic.  HENT:     Head: Normocephalic and atraumatic.     Right Ear: Tympanic membrane, ear canal and external ear normal.     Left Ear: Tympanic membrane, ear canal and external ear normal.     Nose: Nose normal. No congestion or rhinorrhea.     Mouth/Throat:     Mouth: Mucous membranes are moist.     Pharynx: Oropharynx is clear. No oropharyngeal exudate or posterior oropharyngeal erythema.  Eyes:     General: Lids are normal. Vision grossly intact. Gaze aligned appropriately. No scleral icterus.       Right eye: No discharge.        Left eye: No discharge.     Extraocular Movements: Extraocular movements intact.     Conjunctiva/sclera: Conjunctivae normal.     Pupils: Pupils are equal, round, and reactive to light.  Neck:     Thyroid: No thyromegaly.     Vascular: No JVD.     Trachea: Trachea and phonation normal. No tracheal deviation.  Cardiovascular:     Rate and Rhythm: Normal rate and regular rhythm.     Pulses: Normal pulses.     Heart sounds: Normal heart sounds, S1 normal and S2 normal. No murmur heard.    No friction rub. No gallop.  Pulmonary:     Effort: Pulmonary effort is normal. No  accessory muscle usage or respiratory distress.     Breath sounds: Normal breath sounds and air entry. No stridor. No wheezing or rales.  Chest:     Chest wall: No tenderness.  Abdominal:     General: Bowel sounds are normal. There is no distension.     Palpations: Abdomen is soft. There is no shifting dullness, fluid wave, mass or pulsatile mass.     Tenderness: There is no abdominal tenderness. There is no guarding or rebound.  Musculoskeletal:        General: No tenderness or deformity. Normal range of motion.     Cervical back: Normal range of motion and neck supple.     Right lower leg: No edema.     Left lower leg: No edema.  Lymphadenopathy:     Cervical: No cervical adenopathy.  Skin:    General: Skin is warm and  dry.     Capillary Refill: Capillary refill takes less than 2 seconds.     Coloration: Skin is not pale.     Findings: No erythema or rash.  Neurological:     Mental Status: He is alert and oriented to person, place, and time.     Cranial Nerves: No cranial nerve deficit.     Motor: No abnormal muscle tone.     Coordination: Coordination normal.     Gait: Gait normal.     Deep Tendon Reflexes: Reflexes are normal and symmetric.  Psychiatric:        Mood and Affect: Mood normal.        Behavior: Behavior normal. Behavior is cooperative.        Thought Content: Thought content normal.        Judgment: Judgment normal.        Assessment/Plan: 1. Encounter for routine adult health examination with abnormal findings Age-appropriate preventive screenings and vaccinations discussed, annual physical exam completed. Routine labs for health maintenance results discussed with patient today. PHM updated.   2. Chronic midline low back pain without sciatica Reminded patient to get his xray done.  3. ADHD (attention deficit hyperactivity disorder), predominantly hyperactive impulsive type Refills x3 months ordered, an extra dose of short acting adderall provided to  take around midday  - amphetamine-dextroamphetamine (ADDERALL) 10 MG tablet; Take 1 tablet (10 mg total) by mouth daily at 12 noon.  Dispense: 30 tablet; Refill: 0 - amphetamine-dextroamphetamine (ADDERALL XR) 15 MG 24 hr capsule; Take 1 capsule by mouth every morning.  Dispense: 30 capsule; Refill: 0 - amphetamine-dextroamphetamine (ADDERALL XR) 15 MG 24 hr capsule; Take 1 capsule by mouth every morning.  Dispense: 30 capsule; Refill: 0 - amphetamine-dextroamphetamine (ADDERALL XR) 15 MG 24 hr capsule; Take 1 capsule by mouth every morning.  Dispense: 30 capsule; Refill: 0 - amphetamine-dextroamphetamine (ADDERALL) 10 MG tablet; Take 1 tablet (10 mg total) by mouth daily at 12 noon.  Dispense: 30 tablet; Refill: 0 - amphetamine-dextroamphetamine (ADDERALL) 10 MG tablet; Take 1 tablet (10 mg total) by mouth daily at 12 noon.  Dispense: 30 tablet; Refill: 0  4. Need for vaccination - Tdap (BOOSTRIX) 5-2.5-18.5 LF-MCG/0.5 injection; Inject 0.5 mLs into the muscle once for 1 dose.  Dispense: 0.5 mL; Refill: 0      General Counseling: Keaun verbalizes understanding of the findings of todays visit and agrees with plan of treatment. I have discussed any further diagnostic evaluation that may be needed or ordered today. We also reviewed his medications today. he has been encouraged to call the office with any questions or concerns that should arise related to todays visit.    No orders of the defined types were placed in this encounter.   Meds ordered this encounter  Medications   Tdap (BOOSTRIX) 5-2.5-18.5 LF-MCG/0.5 injection    Sig: Inject 0.5 mLs into the muscle once for 1 dose.    Dispense:  0.5 mL    Refill:  0   amphetamine-dextroamphetamine (ADDERALL) 10 MG tablet    Sig: Take 1 tablet (10 mg total) by mouth daily at 12 noon.    Dispense:  30 tablet    Refill:  0    In addition to extended release adderall prescription. Fill for june   amphetamine-dextroamphetamine (ADDERALL XR) 15  MG 24 hr capsule    Sig: Take 1 capsule by mouth every morning.    Dispense:  30 capsule    Refill:  0  Please fill new script today.   amphetamine-dextroamphetamine (ADDERALL XR) 15 MG 24 hr capsule    Sig: Take 1 capsule by mouth every morning.    Dispense:  30 capsule    Refill:  0    Fill for June   amphetamine-dextroamphetamine (ADDERALL XR) 15 MG 24 hr capsule    Sig: Take 1 capsule by mouth every morning.    Dispense:  30 capsule    Refill:  0    Fill for july   amphetamine-dextroamphetamine (ADDERALL XR) 15 MG 24 hr capsule    Sig: Take 1 capsule by mouth every morning.    Dispense:  30 capsule    Refill:  0    Fill for August   amphetamine-dextroamphetamine (ADDERALL) 10 MG tablet    Sig: Take 1 tablet (10 mg total) by mouth daily at 12 noon.    Dispense:  30 tablet    Refill:  0    In addition to extended release adderall prescription. Fill for july   amphetamine-dextroamphetamine (ADDERALL) 10 MG tablet    Sig: Take 1 tablet (10 mg total) by mouth daily at 12 noon.    Dispense:  30 tablet    Refill:  0    In addition to extended release adderall prescription. Fill for august    Return in about 3 months (around 07/18/2023) for F/U, ADHD med check, Britney Captain PCP, need UDS at next visit. .   Total time spent:30 Minutes Time spent includes review of chart, medications, test results, and follow up plan with the patient.   [place] Controlled Substance Database was reviewed by me.  This patient was seen by Sallyanne Kuster, FNP-C in collaboration with Dr. Beverely Risen as a part of collaborative care agreement.  Cynithia Hakimi R. Tedd Sias, MSN, FNP-C Internal medicine

## 2023-04-25 ENCOUNTER — Encounter: Payer: No Typology Code available for payment source | Admitting: Nurse Practitioner

## 2023-04-29 ENCOUNTER — Telehealth: Payer: Self-pay

## 2023-04-29 DIAGNOSIS — F901 Attention-deficit hyperactivity disorder, predominantly hyperactive type: Secondary | ICD-10-CM

## 2023-05-07 MED ORDER — AMPHETAMINE-DEXTROAMPHET ER 15 MG PO CP24
15.0000 mg | ORAL_CAPSULE | ORAL | 0 refills | Status: DC
Start: 2023-05-07 — End: 2023-07-24

## 2023-05-10 NOTE — Telephone Encounter (Signed)
Done

## 2023-07-24 ENCOUNTER — Encounter: Payer: Self-pay | Admitting: Nurse Practitioner

## 2023-07-24 ENCOUNTER — Ambulatory Visit: Payer: No Typology Code available for payment source | Admitting: Nurse Practitioner

## 2023-07-24 VITALS — BP 126/78 | HR 100 | Temp 98.2°F | Resp 16 | Ht 72.0 in | Wt 182.2 lb

## 2023-07-24 DIAGNOSIS — R7301 Impaired fasting glucose: Secondary | ICD-10-CM | POA: Diagnosis not present

## 2023-07-24 DIAGNOSIS — Z79899 Other long term (current) drug therapy: Secondary | ICD-10-CM | POA: Diagnosis not present

## 2023-07-24 DIAGNOSIS — F901 Attention-deficit hyperactivity disorder, predominantly hyperactive type: Secondary | ICD-10-CM | POA: Diagnosis not present

## 2023-07-24 LAB — POCT URINE DRUG SCREEN
Methylenedioxyamphetamine: NOT DETECTED
POC Amphetamine UR: POSITIVE — AB
POC BENZODIAZEPINES UR: NOT DETECTED
POC Barbiturate UR: NOT DETECTED
POC Cocaine UR: NOT DETECTED
POC Ecstasy UR: NOT DETECTED
POC Marijuana UR: POSITIVE — AB
POC Methadone UR: NOT DETECTED
POC Methamphetamine UR: NOT DETECTED
POC Opiate Ur: NOT DETECTED
POC Oxycodone UR: NOT DETECTED
POC PHENCYCLIDINE UR: NOT DETECTED
POC TRICYCLICS UR: NOT DETECTED

## 2023-07-24 LAB — POCT GLYCOSYLATED HEMOGLOBIN (HGB A1C): Hemoglobin A1C: 5.1 % (ref 4.0–5.6)

## 2023-07-24 MED ORDER — AMPHETAMINE-DEXTROAMPHETAMINE 10 MG PO TABS
10.0000 mg | ORAL_TABLET | Freq: Every day | ORAL | 0 refills | Status: DC
Start: 2023-09-18 — End: 2023-11-14

## 2023-07-24 MED ORDER — AMPHETAMINE-DEXTROAMPHET ER 15 MG PO CP24
15.0000 mg | ORAL_CAPSULE | ORAL | 0 refills | Status: DC
Start: 2023-07-24 — End: 2023-11-14

## 2023-07-24 MED ORDER — AMPHETAMINE-DEXTROAMPHET ER 15 MG PO CP24: 15.0000 mg | ORAL_CAPSULE | ORAL | 0 refills | Status: DC

## 2023-07-24 MED ORDER — AMPHETAMINE-DEXTROAMPHETAMINE 10 MG PO TABS: 10.0000 mg | ORAL_TABLET | Freq: Every day | ORAL | 0 refills | Status: DC

## 2023-07-24 MED ORDER — AMPHETAMINE-DEXTROAMPHETAMINE 10 MG PO TABS
10.0000 mg | ORAL_TABLET | Freq: Every day | ORAL | 0 refills | Status: DC
Start: 2023-07-24 — End: 2023-11-14

## 2023-07-24 NOTE — Progress Notes (Signed)
[facility] [address] [place], [place] [address]  Internal MEDICINE  Office Visit Note  Patient Name: Scott Mcgrath  [id number]  [id number]  Date of Service: 07/24/2023  Chief Complaint  Patient presents with   Depression   Follow-up    HPI Coulton presents for a follow-up visit for ADHD, glucose issues , drug screen.  Hypoglycemic episodes ? Impaired fasting glucose in the past -- A1c is normal at 5.1. reminded patient to eat regular meals and occasional healthy snack.  Distal biceps pain with weakness -- discussed possible pulled muscle or other possible causes  ADHD -- current dose is effective, heart rate and BP are normal. Denies any palpitations or other adverse side effects of the medication. Drug screen positive for amphetamine.  FYI -- per patient -- Thc for vaping for sleep at night due to night terrors -- positive on drug screen.  Also still has not had lumbar xray done.    Current Medication: Outpatient Encounter Medications as of 07/24/2023  Medication Sig   [DISCONTINUED] amphetamine-dextroamphetamine (ADDERALL XR) 15 MG 24 hr capsule Take 1 capsule by mouth every morning.   [DISCONTINUED] amphetamine-dextroamphetamine (ADDERALL XR) 15 MG 24 hr capsule Take 1 capsule by mouth every morning.   [DISCONTINUED] amphetamine-dextroamphetamine (ADDERALL XR) 15 MG 24 hr capsule Take 1 capsule by mouth every morning.   [DISCONTINUED] amphetamine-dextroamphetamine (ADDERALL) 10 MG tablet Take 1 tablet (10 mg total) by mouth daily at 12 noon.   [DISCONTINUED] amphetamine-dextroamphetamine (ADDERALL) 10 MG tablet Take 1 tablet (10 mg total) by mouth daily at 12 noon.   [DISCONTINUED] amphetamine-dextroamphetamine (ADDERALL) 10 MG tablet Take 1 tablet (10 mg total) by mouth daily at 12 noon.   [START ON 09/18/2023] amphetamine-dextroamphetamine (ADDERALL XR) 15 MG 24 hr capsule Take 1 capsule by mouth every morning.   [START ON 08/21/2023]  amphetamine-dextroamphetamine (ADDERALL XR) 15 MG 24 hr capsule Take 1 capsule by mouth every morning.   amphetamine-dextroamphetamine (ADDERALL XR) 15 MG 24 hr capsule Take 1 capsule by mouth every morning.   [START ON 09/18/2023] amphetamine-dextroamphetamine (ADDERALL) 10 MG tablet Take 1 tablet (10 mg total) by mouth daily at 12 noon.   [START ON 08/21/2023] amphetamine-dextroamphetamine (ADDERALL) 10 MG tablet Take 1 tablet (10 mg total) by mouth daily at 12 noon.   amphetamine-dextroamphetamine (ADDERALL) 10 MG tablet Take 1 tablet (10 mg total) by mouth daily at 12 noon.   No facility-administered encounter medications on file as of 07/24/2023.    Surgical History: History reviewed. No pertinent surgical history.  Medical History: Past Medical History:  Diagnosis Date   Depression     Family History: Family History  Problem Relation Age of Onset   Diabetes Mother    ADD / ADHD Brother    Asthma Brother     Social History   Socioeconomic History   Marital status: Single    Spouse name: Not on file   Number of children: Not on file   Years of education: Not on file   Highest education level: Not on file  Occupational History   Not on file  Tobacco Use   Smoking status: Never   Smokeless tobacco: Current    Types: Chew  Substance and Sexual Activity   Alcohol use: Yes    Comment: occasionally   Drug use: Yes    Types: Marijuana    Comment: "every once in a while"   Sexual activity: Not Currently  Other Topics Concern   Not on file  Social  History Narrative   Not on file   Social Determinants of Health   Financial Resource Strain: Not on file  Food Insecurity: Not on file  Transportation Needs: Not on file  Physical Activity: Not on file  Stress: Not on file  Social Connections: Not on file  Intimate Partner Violence: Not on file      Review of Systems  Constitutional:  Positive for diaphoresis and fatigue. Negative for chills and unexpected weight  change.  HENT:  Negative for congestion, rhinorrhea, sneezing and sore throat.   Respiratory: Negative.  Negative for cough, chest tightness, shortness of breath and wheezing.   Cardiovascular: Negative.  Negative for chest pain and palpitations.  Gastrointestinal:  Negative for abdominal pain, constipation, diarrhea, nausea and vomiting.  Musculoskeletal:  Positive for arthralgias and back pain. Negative for joint swelling and neck pain.  Skin:  Negative for rash.  Neurological: Negative.   Psychiatric/Behavioral: Negative.  Negative for behavioral problems (Depression), sleep disturbance and suicidal ideas. The patient is not nervous/anxious.     Vital Signs: BP 126/78   Pulse 100   Temp 98.2 F (36.8 C)   Resp 16   Ht 6' (1.829 m)   Wt 182 lb 3.2 oz (82.6 kg)   SpO2 98%   BMI 24.71 kg/m    Physical Exam Vitals reviewed.  Constitutional:      General: He is not in acute distress.    Appearance: Normal appearance. He is normal weight. He is not ill-appearing.  HENT:     Head: Normocephalic and atraumatic.  Eyes:     Pupils: Pupils are equal, round, and reactive to light.  Cardiovascular:     Rate and Rhythm: Normal rate and regular rhythm.  Pulmonary:     Effort: Pulmonary effort is normal. No respiratory distress.  Neurological:     Mental Status: He is alert and oriented to person, place, and time.  Psychiatric:        Mood and Affect: Mood normal.        Behavior: Behavior normal.        Assessment/Plan: 1. Impaired fasting glucose A1c was checked and was normal, will continue to monitor periodically  - POCT glycosylated hemoglobin (Hb A1C)  2. ADHD (attention deficit hyperactivity disorder), predominantly hyperactive impulsive type Continue adderall as prescribed. Follow up in 3 months additional refills, repeat UDS again in 6 months  - amphetamine-dextroamphetamine (ADDERALL XR) 15 MG 24 hr capsule; Take 1 capsule by mouth every morning.  Dispense: 30  capsule; Refill: 0 - amphetamine-dextroamphetamine (ADDERALL XR) 15 MG 24 hr capsule; Take 1 capsule by mouth every morning.  Dispense: 30 capsule; Refill: 0 - amphetamine-dextroamphetamine (ADDERALL) 10 MG tablet; Take 1 tablet (10 mg total) by mouth daily at 12 noon.  Dispense: 30 tablet; Refill: 0 - amphetamine-dextroamphetamine (ADDERALL) 10 MG tablet; Take 1 tablet (10 mg total) by mouth daily at 12 noon.  Dispense: 30 tablet; Refill: 0 - amphetamine-dextroamphetamine (ADDERALL XR) 15 MG 24 hr capsule; Take 1 capsule by mouth every morning.  Dispense: 30 capsule; Refill: 0 - amphetamine-dextroamphetamine (ADDERALL) 10 MG tablet; Take 1 tablet (10 mg total) by mouth daily at 12 noon.  Dispense: 30 tablet; Refill: 0  3. Long-term use of high-risk medication Results consistent with current prescriptions and self-reported use if delta8 vaping solution, repeat UDS in 6 months  - POCT Urine Drug Screen   General Counseling: Lynette verbalizes understanding of the findings of todays visit and agrees with plan of treatment. I  have discussed any further diagnostic evaluation that may be needed or ordered today. We also reviewed his medications today. he has been encouraged to call the office with any questions or concerns that should arise related to todays visit.    Orders Placed This Encounter  Procedures   POCT Urine Drug Screen   POCT glycosylated hemoglobin (Hb A1C)    Meds ordered this encounter  Medications   amphetamine-dextroamphetamine (ADDERALL XR) 15 MG 24 hr capsule    Sig: Take 1 capsule by mouth every morning.    Dispense:  30 capsule    Refill:  0    Fill for november   amphetamine-dextroamphetamine (ADDERALL XR) 15 MG 24 hr capsule    Sig: Take 1 capsule by mouth every morning.    Dispense:  30 capsule    Refill:  0    Fill for october   amphetamine-dextroamphetamine (ADDERALL) 10 MG tablet    Sig: Take 1 tablet (10 mg total) by mouth daily at 12 noon.    Dispense:  30  tablet    Refill:  0    In addition to extended release adderall prescription. Fill for november   amphetamine-dextroamphetamine (ADDERALL) 10 MG tablet    Sig: Take 1 tablet (10 mg total) by mouth daily at 12 noon.    Dispense:  30 tablet    Refill:  0    In addition to extended release adderall prescription. Fill for October   amphetamine-dextroamphetamine (ADDERALL XR) 15 MG 24 hr capsule    Sig: Take 1 capsule by mouth every morning.    Dispense:  30 capsule    Refill:  0    Fill for september   amphetamine-dextroamphetamine (ADDERALL) 10 MG tablet    Sig: Take 1 tablet (10 mg total) by mouth daily at 12 noon.    Dispense:  30 tablet    Refill:  0    In addition to extended release adderall prescription. Fill for september    Return in about 12 weeks (around 10/16/2023) for F/U, ADHD med check, Andrez Lieurance PCP.   Total time spent:30 Minutes Time spent includes review of chart, medications, test results, and follow up plan with the patient.   [place] Controlled Substance Database was reviewed by me.  This patient was seen by Sallyanne Kuster, FNP-C in collaboration with Dr. Beverely Risen as a part of collaborative care agreement.   Dhaval Woo R. Tedd Sias, MSN, FNP-C Internal medicine

## 2023-09-19 ENCOUNTER — Encounter: Payer: Self-pay | Admitting: Nurse Practitioner

## 2023-10-04 ENCOUNTER — Telehealth: Payer: Self-pay | Admitting: Nurse Practitioner

## 2023-10-04 NOTE — Telephone Encounter (Signed)
Lvm regarding 10/16/23 appointment-[name]

## 2023-10-16 ENCOUNTER — Ambulatory Visit: Payer: No Typology Code available for payment source | Admitting: Internal Medicine

## 2023-11-14 ENCOUNTER — Ambulatory Visit: Payer: No Typology Code available for payment source | Admitting: Nurse Practitioner

## 2023-11-14 ENCOUNTER — Encounter: Payer: Self-pay | Admitting: Nurse Practitioner

## 2023-11-14 VITALS — BP 130/78 | HR 98 | Temp 98.7°F | Resp 16 | Ht 72.0 in | Wt 185.6 lb

## 2023-11-14 DIAGNOSIS — F901 Attention-deficit hyperactivity disorder, predominantly hyperactive type: Secondary | ICD-10-CM

## 2023-11-14 DIAGNOSIS — M545 Low back pain, unspecified: Secondary | ICD-10-CM

## 2023-11-14 DIAGNOSIS — G8929 Other chronic pain: Secondary | ICD-10-CM | POA: Diagnosis not present

## 2023-11-14 MED ORDER — AMPHETAMINE-DEXTROAMPHETAMINE 10 MG PO TABS: 10.0000 mg | ORAL_TABLET | Freq: Every day | ORAL | 0 refills | Status: DC

## 2023-11-14 MED ORDER — AMPHETAMINE-DEXTROAMPHETAMINE 10 MG PO TABS
10.0000 mg | ORAL_TABLET | Freq: Every day | ORAL | 0 refills | Status: DC
Start: 2024-01-13 — End: 2024-02-19

## 2023-11-14 MED ORDER — AMPHETAMINE-DEXTROAMPHET ER 15 MG PO CP24: 15.0000 mg | ORAL_CAPSULE | ORAL | 0 refills | Status: DC

## 2023-11-14 MED ORDER — AMPHETAMINE-DEXTROAMPHET ER 15 MG PO CP24
15.0000 mg | ORAL_CAPSULE | ORAL | 0 refills | Status: DC
Start: 2023-12-16 — End: 2024-02-19

## 2023-11-14 MED ORDER — AMPHETAMINE-DEXTROAMPHET ER 15 MG PO CP24
15.0000 mg | ORAL_CAPSULE | ORAL | 0 refills | Status: DC
Start: 2023-11-14 — End: 2024-02-19

## 2023-11-14 NOTE — Progress Notes (Signed)
 [facility] [address] [place], [place] [address]  Internal MEDICINE  Office Visit Note  Patient Name: Scott Mcgrath  [id number]  [id number]  Date of Service: 11/14/2023  Chief Complaint  Patient presents with   Depression   Follow-up    HPI Scott Mcgrath presents for a follow-up visit for chronic back pain and ADHD.  Chronic back pain -- stable, no issues at this time ADHD -- current dose is effective. Requesting 14 day supply be sent to local pharmacy since he is completely out of his medication and [facility]. BP and heart rate are stable. Denies any palpitations or other adverse side effects of the medication.     Current Medication: Outpatient Encounter Medications as of 11/14/2023  Medication Sig   [DISCONTINUED] amphetamine -dextroamphetamine  (ADDERALL XR) 15 MG 24 hr capsule Take 1 capsule by mouth every morning.   [DISCONTINUED] amphetamine -dextroamphetamine  (ADDERALL XR) 15 MG 24 hr capsule Take 1 capsule by mouth every morning.   [DISCONTINUED] amphetamine -dextroamphetamine  (ADDERALL XR) 15 MG 24 hr capsule Take 1 capsule by mouth every morning.   [DISCONTINUED] amphetamine -dextroamphetamine  (ADDERALL) 10 MG tablet Take 1 tablet (10 mg total) by mouth daily at 12 noon.   [DISCONTINUED] amphetamine -dextroamphetamine  (ADDERALL) 10 MG tablet Take 1 tablet (10 mg total) by mouth daily at 12 noon.   [DISCONTINUED] amphetamine -dextroamphetamine  (ADDERALL) 10 MG tablet Take 1 tablet (10 mg total) by mouth daily at 12 noon.   amphetamine -dextroamphetamine  (ADDERALL XR) 15 MG 24 hr capsule Take 1 capsule by mouth every morning.   [START ON 12/16/2023] amphetamine -dextroamphetamine  (ADDERALL XR) 15 MG 24 hr capsule Take 1 capsule by mouth every morning.   [START ON 01/13/2024] amphetamine -dextroamphetamine  (ADDERALL XR) 15 MG 24 hr capsule Take 1 capsule by mouth every morning.   amphetamine -dextroamphetamine  (ADDERALL XR) 15 MG 24 hr capsule  Take 1 capsule by mouth every morning.   [START ON 01/13/2024] amphetamine -dextroamphetamine  (ADDERALL) 10 MG tablet Take 1 tablet (10 mg total) by mouth daily at 12 noon.   [START ON 12/16/2023] amphetamine -dextroamphetamine  (ADDERALL) 10 MG tablet Take 1 tablet (10 mg total) by mouth daily at 12 noon.   [START ON 11/18/2023] amphetamine -dextroamphetamine  (ADDERALL) 10 MG tablet Take 1 tablet (10 mg total) by mouth daily at 12 noon.   No facility-administered encounter medications on file as of 11/14/2023.    Surgical History: History reviewed. No pertinent surgical history.  Medical History: Past Medical History:  Diagnosis Date   Depression     Family History: Family History  Problem Relation Age of Onset   Diabetes Mother    ADD / ADHD Brother    Asthma Brother     Social History   Socioeconomic History   Marital status: Single    Spouse name: Not on file   Number of children: Not on file   Years of education: Not on file   Highest education level: Not on file  Occupational History   Not on file  Tobacco Use   Smoking status: Never   Smokeless tobacco: Current    Types: Chew  Substance and Sexual Activity   Alcohol use: Yes    Comment: occasionally   Drug use: Yes    Types: Marijuana    Comment: every once in a while   Sexual activity: Not Currently  Other Topics Concern   Not on file  Social History Narrative   Not on file   Social Drivers of Health   Financial Resource Strain: Not on file  Food Insecurity: Not on file  Transportation Needs: Not on file  Physical Activity: Not on file  Stress: Not on file  Social Connections: Not on file  Intimate Partner Violence: Not on file      Review of Systems  Constitutional:  Positive for fatigue. Negative for chills, diaphoresis and unexpected weight change.  HENT:  Negative for congestion, rhinorrhea, sneezing and sore throat.   Respiratory: Negative.  Negative for cough, chest tightness, shortness of breath  and wheezing.   Cardiovascular: Negative.  Negative for chest pain and palpitations.  Gastrointestinal:  Negative for abdominal pain, constipation, diarrhea, nausea and vomiting.  Musculoskeletal:  Positive for arthralgias and back pain. Negative for joint swelling and neck pain.  Skin:  Negative for rash.  Neurological: Negative.   Psychiatric/Behavioral:  Positive for decreased concentration. Negative for behavioral problems (Depression), sleep disturbance and suicidal ideas. The patient is not nervous/anxious.     Vital Signs: BP 130/78   Pulse (!) 115   Temp 98.7 F (37.1 C)   Resp 16   Ht 6' (1.829 m)   Wt 185 lb 9.6 oz (84.2 kg)   SpO2 98%   BMI 25.17 kg/m    Physical Exam Vitals reviewed.  Constitutional:      General: He is not in acute distress.    Appearance: Normal appearance. He is not ill-appearing.  HENT:     Head: Normocephalic and atraumatic.  Eyes:     Pupils: Pupils are equal, round, and reactive to light.  Cardiovascular:     Rate and Rhythm: Normal rate and regular rhythm.  Pulmonary:     Effort: Pulmonary effort is normal. No respiratory distress.  Neurological:     Mental Status: He is alert and oriented to person, place, and time.  Psychiatric:        Mood and Affect: Mood normal.        Behavior: Behavior normal.        Assessment/Plan: 1. Chronic midline low back pain without sciatica (Primary) Stable,  2. ADHD (attention deficit hyperactivity disorder), predominantly hyperactive impulsive type Refills of adderall x3 months ordered, continue as prescribed. Follow up in 3 months for additional refills and UDS due at next office visit. - amphetamine -dextroamphetamine  (ADDERALL XR) 15 MG 24 hr capsule; Take 1 capsule by mouth every morning.  Dispense: 30 capsule; Refill: 0 - amphetamine -dextroamphetamine  (ADDERALL XR) 15 MG 24 hr capsule; Take 1 capsule by mouth every morning.  Dispense: 30 capsule; Refill: 0 - amphetamine -dextroamphetamine   (ADDERALL) 10 MG tablet; Take 1 tablet (10 mg total) by mouth daily at 12 noon.  Dispense: 30 tablet; Refill: 0 - amphetamine -dextroamphetamine  (ADDERALL) 10 MG tablet; Take 1 tablet (10 mg total) by mouth daily at 12 noon.  Dispense: 30 tablet; Refill: 0 - amphetamine -dextroamphetamine  (ADDERALL XR) 15 MG 24 hr capsule; Take 1 capsule by mouth every morning.  Dispense: 30 capsule; Refill: 0 - amphetamine -dextroamphetamine  (ADDERALL) 10 MG tablet; Take 1 tablet (10 mg total) by mouth daily at 12 noon.  Dispense: 30 tablet; Refill: 0 - amphetamine -dextroamphetamine  (ADDERALL XR) 15 MG 24 hr capsule; Take 1 capsule by mouth every morning.  Dispense: 14 capsule; Refill: 0   General Counseling: Scott Mcgrath verbalizes understanding of the findings of todays visit and agrees with plan of treatment. I have discussed any further diagnostic evaluation that may be needed or ordered today. We also reviewed his medications today. he has been encouraged to call the office with any questions or concerns that should arise related to  todays visit.    No orders of the defined types were placed in this encounter.   Meds ordered this encounter  Medications   amphetamine -dextroamphetamine  (ADDERALL XR) 15 MG 24 hr capsule    Sig: Take 1 capsule by mouth every morning.    Dispense:  30 capsule    Refill:  0    Fill for november   amphetamine -dextroamphetamine  (ADDERALL XR) 15 MG 24 hr capsule    Sig: Take 1 capsule by mouth every morning.    Dispense:  30 capsule    Refill:  0    Fill for February   amphetamine -dextroamphetamine  (ADDERALL) 10 MG tablet    Sig: Take 1 tablet (10 mg total) by mouth daily at 12 noon.    Dispense:  30 tablet    Refill:  0    In addition to extended release adderall prescription. Fill for march   amphetamine -dextroamphetamine  (ADDERALL) 10 MG tablet    Sig: Take 1 tablet (10 mg total) by mouth daily at 12 noon.    Dispense:  30 tablet    Refill:  0    In addition to extended  release adderall prescription. Fill for February   amphetamine -dextroamphetamine  (ADDERALL XR) 15 MG 24 hr capsule    Sig: Take 1 capsule by mouth every morning.    Dispense:  30 capsule    Refill:  0    Fill for march   amphetamine -dextroamphetamine  (ADDERALL) 10 MG tablet    Sig: Take 1 tablet (10 mg total) by mouth daily at 12 noon.    Dispense:  30 tablet    Refill:  0    In addition to extended release adderall prescription. Fill for January   amphetamine -dextroamphetamine  (ADDERALL XR) 15 MG 24 hr capsule    Sig: Take 1 capsule by mouth every morning.    Dispense:  14 capsule    Refill:  0    Fill today asap please, patient is out of medication    Return in about 3 months (around 02/05/2024) for F/U, ADHD med check, Estefania Kamiya PCP. UDS at next visit .   Total time spent:20 Minutes Time spent includes review of chart, medications, test results, and follow up plan with the patient.    Controlled Substance Database was reviewed by me.  This patient was seen by Mardy Maxin, FNP-C in collaboration with Dr. Sigrid Bathe as a part of collaborative care agreement.   Christina Waldrop R. Maxin, MSN, FNP-C Internal medicine

## 2024-02-19 ENCOUNTER — Ambulatory Visit (INDEPENDENT_AMBULATORY_CARE_PROVIDER_SITE_OTHER): Payer: No Typology Code available for payment source | Admitting: Nurse Practitioner

## 2024-02-19 ENCOUNTER — Encounter: Payer: Self-pay | Admitting: Nurse Practitioner

## 2024-02-19 VITALS — BP 108/70 | HR 110 | Temp 98.6°F | Resp 16 | Ht 72.0 in | Wt 185.6 lb

## 2024-02-19 DIAGNOSIS — M545 Low back pain, unspecified: Secondary | ICD-10-CM | POA: Diagnosis not present

## 2024-02-19 DIAGNOSIS — F901 Attention-deficit hyperactivity disorder, predominantly hyperactive type: Secondary | ICD-10-CM | POA: Diagnosis not present

## 2024-02-19 DIAGNOSIS — Z79899 Other long term (current) drug therapy: Secondary | ICD-10-CM

## 2024-02-19 DIAGNOSIS — G8929 Other chronic pain: Secondary | ICD-10-CM

## 2024-02-19 LAB — POCT URINE DRUG SCREEN
Methylenedioxyamphetamine: NOT DETECTED
POC Amphetamine UR: POSITIVE — AB
POC BENZODIAZEPINES UR: NOT DETECTED
POC Barbiturate UR: NOT DETECTED
POC Cocaine UR: NOT DETECTED
POC Ecstasy UR: NOT DETECTED
POC Marijuana UR: POSITIVE — AB
POC Methadone UR: NOT DETECTED
POC Methamphetamine UR: NOT DETECTED
POC Opiate Ur: NOT DETECTED
POC Oxycodone UR: NOT DETECTED
POC PHENCYCLIDINE UR: NOT DETECTED
POC TRICYCLICS UR: NOT DETECTED

## 2024-02-19 MED ORDER — AMPHETAMINE-DEXTROAMPHET ER 15 MG PO CP24: 15.0000 mg | ORAL_CAPSULE | ORAL | 0 refills | Status: DC

## 2024-02-19 MED ORDER — AMPHETAMINE-DEXTROAMPHETAMINE 10 MG PO TABS: 10.0000 mg | ORAL_TABLET | Freq: Every day | ORAL | 0 refills | Status: DC

## 2024-02-19 NOTE — Progress Notes (Signed)
 [facility] [address] [place], [place] [address]  Internal MEDICINE  Office Visit Note  Patient Name: Scott Mcgrath  [id number]  [id number]  Date of Service: 02/19/2024  Chief Complaint  Patient presents with   Depression   Follow-up    HPI Scott Mcgrath presents for a follow-up visit for chronic back pain, ADHD and UDS.  Chronic back pain -- manageable without medication. Does not take any OTC or prescription medication at this time.  ADHD -- current dose remains effective. Heart rate and blood pressure are normal. Denies any palpitations or other adverse side effects of the medication. Due for refills.  Due for UDS today.     Current Medication: Outpatient Encounter Medications as of 02/19/2024  Medication Sig   amphetamine-dextroamphetamine (ADDERALL XR) 15 MG 24 hr capsule Take 1 capsule by mouth every morning.   [DISCONTINUED] amphetamine-dextroamphetamine (ADDERALL XR) 15 MG 24 hr capsule Take 1 capsule by mouth every morning.   [DISCONTINUED] amphetamine-dextroamphetamine (ADDERALL XR) 15 MG 24 hr capsule Take 1 capsule by mouth every morning.   [DISCONTINUED] amphetamine-dextroamphetamine (ADDERALL XR) 15 MG 24 hr capsule Take 1 capsule by mouth every morning.   [DISCONTINUED] amphetamine-dextroamphetamine (ADDERALL) 10 MG tablet Take 1 tablet (10 mg total) by mouth daily at 12 noon.   [DISCONTINUED] amphetamine-dextroamphetamine (ADDERALL) 10 MG tablet Take 1 tablet (10 mg total) by mouth daily at 12 noon.   [DISCONTINUED] amphetamine-dextroamphetamine (ADDERALL) 10 MG tablet Take 1 tablet (10 mg total) by mouth daily at 12 noon.   [START ON 04/15/2024] amphetamine-dextroamphetamine (ADDERALL XR) 15 MG 24 hr capsule Take 1 capsule by mouth every morning.   [START ON 03/18/2024] amphetamine-dextroamphetamine (ADDERALL XR) 15 MG 24 hr capsule Take 1 capsule by mouth every morning.   amphetamine-dextroamphetamine (ADDERALL XR) 15 MG 24 hr capsule Take 1 capsule by mouth  every morning.   [START ON 04/15/2024] amphetamine-dextroamphetamine (ADDERALL) 10 MG tablet Take 1 tablet (10 mg total) by mouth daily at 12 noon.   [START ON 03/18/2024] amphetamine-dextroamphetamine (ADDERALL) 10 MG tablet Take 1 tablet (10 mg total) by mouth daily at 12 noon.   amphetamine-dextroamphetamine (ADDERALL) 10 MG tablet Take 1 tablet (10 mg total) by mouth daily at 12 noon.   No facility-administered encounter medications on file as of 02/19/2024.    Surgical History: History reviewed. No pertinent surgical history.  Medical History: Past Medical History:  Diagnosis Date   Depression     Family History: Family History  Problem Relation Age of Onset   Diabetes Mother    ADD / ADHD Brother    Asthma Brother     Social History   Socioeconomic History   Marital status: Single    Spouse name: Not on file   Number of children: Not on file   Years of education: Not on file   Highest education level: Not on file  Occupational History   Not on file  Tobacco Use   Smoking status: Never   Smokeless tobacco: Current    Types: Chew  Substance and Sexual Activity   Alcohol use: Yes    Comment: occasionally   Drug use: Yes    Types: Marijuana    Comment: "every once in a while"   Sexual activity: Not Currently  Other Topics Concern   Not on file  Social History Narrative   Not on file   Social Drivers of Health   Financial Resource Strain: Not on file  Food Insecurity: Not on file  Transportation Needs: Not  on file  Physical Activity: Not on file  Stress: Not on file  Social Connections: Not on file  Intimate Partner Violence: Not on file      Review of Systems  Constitutional:  Positive for fatigue. Negative for chills, diaphoresis and unexpected weight change.  HENT:  Negative for congestion, rhinorrhea, sneezing and sore throat.   Respiratory: Negative.  Negative for cough, chest tightness, shortness of breath and wheezing.   Cardiovascular: Negative.   Negative for chest pain and palpitations.  Gastrointestinal:  Negative for abdominal pain, constipation, diarrhea, nausea and vomiting.  Musculoskeletal:  Positive for arthralgias and back pain. Negative for joint swelling and neck pain.  Skin:  Negative for rash.  Neurological: Negative.   Psychiatric/Behavioral:  Positive for decreased concentration. Negative for behavioral problems (Depression), sleep disturbance and suicidal ideas. The patient is not nervous/anxious.     Vital Signs: BP 108/70   Pulse (!) 110   Temp 98.6 F (37 C)   Resp 16   Ht 6' (1.829 m)   Wt 185 lb 9.6 oz (84.2 kg)   SpO2 96%   BMI 25.17 kg/m    Physical Exam Vitals reviewed.  Constitutional:      General: He is not in acute distress.    Appearance: Normal appearance. He is not ill-appearing.  HENT:     Head: Normocephalic and atraumatic.  Eyes:     Pupils: Pupils are equal, round, and reactive to light.  Cardiovascular:     Rate and Rhythm: Normal rate and regular rhythm.  Pulmonary:     Effort: Pulmonary effort is normal. No respiratory distress.  Neurological:     Mental Status: He is alert and oriented to person, place, and time.  Psychiatric:        Mood and Affect: Mood normal.        Behavior: Behavior normal.        Assessment/Plan: 1. Chronic midline low back pain without sciatica (Primary) Continue stretching and OTC interventions and medications as needed.   2. ADHD (attention deficit hyperactivity disorder), predominantly hyperactive impulsive type Continue adderall as prescribed. Follow up in 3 months for additional refills  - amphetamine-dextroamphetamine (ADDERALL XR) 15 MG 24 hr capsule; Take 1 capsule by mouth every morning.  Dispense: 30 capsule; Refill: 0 - amphetamine-dextroamphetamine (ADDERALL) 10 MG tablet; Take 1 tablet (10 mg total) by mouth daily at 12 noon.  Dispense: 30 tablet; Refill: 0 - amphetamine-dextroamphetamine (ADDERALL) 10 MG tablet; Take 1 tablet (10  mg total) by mouth daily at 12 noon.  Dispense: 30 tablet; Refill: 0 - amphetamine-dextroamphetamine (ADDERALL XR) 15 MG 24 hr capsule; Take 1 capsule by mouth every morning.  Dispense: 30 capsule; Refill: 0 - amphetamine-dextroamphetamine (ADDERALL) 10 MG tablet; Take 1 tablet (10 mg total) by mouth daily at 12 noon.  Dispense: 30 tablet; Refill: 0 - amphetamine-dextroamphetamine (ADDERALL XR) 15 MG 24 hr capsule; Take 1 capsule by mouth every morning.  Dispense: 30 capsule; Refill: 0 - POCT Urine Drug Screen  3. Long-term use of high-risk medication UDS done today, positive for amphetamine which is consistent with current prescriptions. Also positive for [facility] which he uses recreationally on weekends.  - POCT Urine Drug Screen   General Counseling: Scott Mcgrath verbalizes understanding of the findings of todays visit and agrees with plan of treatment. I have discussed any further diagnostic evaluation that may be needed or ordered today. We also reviewed his medications today. he has been encouraged to call the office with any questions  or concerns that should arise related to todays visit.    Orders Placed This Encounter  Procedures   POCT Urine Drug Screen    Meds ordered this encounter  Medications   amphetamine-dextroamphetamine (ADDERALL XR) 15 MG 24 hr capsule    Sig: Take 1 capsule by mouth every morning.    Dispense:  30 capsule    Refill:  0    Fill for june   amphetamine-dextroamphetamine (ADDERALL) 10 MG tablet    Sig: Take 1 tablet (10 mg total) by mouth daily at 12 noon.    Dispense:  30 tablet    Refill:  0    In addition to extended release adderall prescription. Fill for june   amphetamine-dextroamphetamine (ADDERALL) 10 MG tablet    Sig: Take 1 tablet (10 mg total) by mouth daily at 12 noon.    Dispense:  30 tablet    Refill:  0    In addition to extended release adderall prescription. Fill for may   amphetamine-dextroamphetamine (ADDERALL XR) 15 MG 24 hr capsule     Sig: Take 1 capsule by mouth every morning.    Dispense:  30 capsule    Refill:  0    Fill for may   amphetamine-dextroamphetamine (ADDERALL) 10 MG tablet    Sig: Take 1 tablet (10 mg total) by mouth daily at 12 noon.    Dispense:  30 tablet    Refill:  0    In addition to extended release adderall prescription. Fill for april   amphetamine-dextroamphetamine (ADDERALL XR) 15 MG 24 hr capsule    Sig: Take 1 capsule by mouth every morning.    Dispense:  30 capsule    Refill:  0    Fill for april    Return in about 12 weeks (around 05/13/2024) for F/U, ADHD med check, Amariz Flamenco PCP.   Total time spent:30 Minutes Time spent includes review of chart, medications, test results, and follow up plan with the patient.   [place] Controlled Substance Database was reviewed by me.  This patient was seen by Scott Kuster, FNP-C in collaboration with Dr. Beverely Risen as a part of collaborative care agreement.   Nataliya Graig R. Tedd Sias, MSN, FNP-C Internal medicine

## 2024-04-22 ENCOUNTER — Telehealth: Payer: Self-pay | Admitting: Nurse Practitioner

## 2024-04-22 NOTE — Telephone Encounter (Signed)
 Left vm and sent mychart message to confirm 04/29/24 appointment-[name]

## 2024-04-29 ENCOUNTER — Ambulatory Visit (INDEPENDENT_AMBULATORY_CARE_PROVIDER_SITE_OTHER): Payer: No Typology Code available for payment source | Admitting: Nurse Practitioner

## 2024-04-29 ENCOUNTER — Encounter: Payer: Self-pay | Admitting: Nurse Practitioner

## 2024-04-29 VITALS — BP 110/74 | HR 72 | Temp 98.4°F | Resp 16 | Ht 72.0 in | Wt 180.8 lb

## 2024-04-29 DIAGNOSIS — R7301 Impaired fasting glucose: Secondary | ICD-10-CM | POA: Insufficient documentation

## 2024-04-29 DIAGNOSIS — F901 Attention-deficit hyperactivity disorder, predominantly hyperactive type: Secondary | ICD-10-CM | POA: Insufficient documentation

## 2024-04-29 DIAGNOSIS — M25561 Pain in right knee: Secondary | ICD-10-CM | POA: Diagnosis not present

## 2024-04-29 DIAGNOSIS — Z0001 Encounter for general adult medical examination with abnormal findings: Secondary | ICD-10-CM

## 2024-04-29 DIAGNOSIS — M545 Low back pain, unspecified: Secondary | ICD-10-CM

## 2024-04-29 DIAGNOSIS — E782 Mixed hyperlipidemia: Secondary | ICD-10-CM

## 2024-04-29 DIAGNOSIS — G8929 Other chronic pain: Secondary | ICD-10-CM | POA: Insufficient documentation

## 2024-04-29 MED ORDER — AMPHETAMINE-DEXTROAMPHETAMINE 10 MG PO TABS: 10.0000 mg | ORAL_TABLET | Freq: Every day | ORAL | 0 refills | Status: DC

## 2024-04-29 MED ORDER — AMPHETAMINE-DEXTROAMPHET ER 15 MG PO CP24: 15.0000 mg | ORAL_CAPSULE | ORAL | 0 refills | Status: DC

## 2024-04-29 NOTE — Progress Notes (Signed)
 [facility] [address] [place], [place] [address]  Internal MEDICINE  Office Visit Note  Patient Name: Scott Mcgrath  [id number]  [id number]  Date of Service: 04/29/2024  Chief Complaint  Patient presents with   Depression   Annual Exam    R knee issues    HPI Derwood presents for an annual well visit and physical exam.  Well-appearing 38 y.o. male with ADHD, chronic low back pain and impaired fasting glucose.  Labs: due for routine labs  New or worsening pain: chronic low back pain and chronic left shoulder pain Other concerns:  Acute right knee pain, started when he was sitting on his knees then became tender but has improved over the past 3 days since he was not been doing as much. Declined referral for now but will call clinic if the pain worsens and he decides to see specialist.    Current Medication: Outpatient Encounter Medications as of 04/29/2024  Medication Sig   amphetamine -dextroamphetamine  (ADDERALL XR) 15 MG 24 hr capsule Take 1 capsule by mouth every morning.   [START ON 05/27/2024] amphetamine -dextroamphetamine  (ADDERALL XR) 15 MG 24 hr capsule Take 1 capsule by mouth every morning.   [START ON 06/24/2024] amphetamine -dextroamphetamine  (ADDERALL XR) 15 MG 24 hr capsule Take 1 capsule by mouth every morning.   amphetamine -dextroamphetamine  (ADDERALL) 10 MG tablet Take 1 tablet (10 mg total) by mouth daily at 12 noon.   [START ON 05/27/2024] amphetamine -dextroamphetamine  (ADDERALL) 10 MG tablet Take 1 tablet (10 mg total) by mouth daily at 12 noon.   [START ON 06/24/2024] amphetamine -dextroamphetamine  (ADDERALL) 10 MG tablet Take 1 tablet (10 mg total) by mouth daily at 12 noon.   [DISCONTINUED] amphetamine -dextroamphetamine  (ADDERALL XR) 15 MG 24 hr capsule Take 1 capsule by mouth every morning.   [DISCONTINUED] amphetamine -dextroamphetamine  (ADDERALL XR) 15 MG 24 hr capsule Take 1 capsule by mouth every morning.   [DISCONTINUED]  amphetamine -dextroamphetamine  (ADDERALL XR) 15 MG 24 hr capsule Take 1 capsule by mouth every morning.   [DISCONTINUED] amphetamine -dextroamphetamine  (ADDERALL XR) 15 MG 24 hr capsule Take 1 capsule by mouth every morning.   [DISCONTINUED] amphetamine -dextroamphetamine  (ADDERALL) 10 MG tablet Take 1 tablet (10 mg total) by mouth daily at 12 noon.   [DISCONTINUED] amphetamine -dextroamphetamine  (ADDERALL) 10 MG tablet Take 1 tablet (10 mg total) by mouth daily at 12 noon.   [DISCONTINUED] amphetamine -dextroamphetamine  (ADDERALL) 10 MG tablet Take 1 tablet (10 mg total) by mouth daily at 12 noon.   No facility-administered encounter medications on file as of 04/29/2024.    Surgical History: History reviewed. No pertinent surgical history.  Medical History: Past Medical History:  Diagnosis Date   Depression     Family History: Family History  Problem Relation Age of Onset   Diabetes Mother    ADD / ADHD Brother    Asthma Brother     Social History   Socioeconomic History   Marital status: Single    Spouse name: Not on file   Number of children: Not on file   Years of education: Not on file   Highest education level: Not on file  Occupational History   Not on file  Tobacco Use   Smoking status: Never   Smokeless tobacco: Current    Types: Chew  Substance and Sexual Activity   Alcohol use: Yes    Comment: occasionally   Drug use: Yes    Types: Marijuana    Comment: every once in a while   Sexual activity: Not Currently  Other Topics  Concern   Not on file  Social History Narrative   Not on file   Social Drivers of Health   Financial Resource Strain: Not on file  Food Insecurity: Not on file  Transportation Needs: Not on file  Physical Activity: Not on file  Stress: Not on file  Social Connections: Not on file  Intimate Partner Violence: Not on file      Review of Systems  Constitutional:  Negative for activity change, appetite change, chills, fatigue, fever  and unexpected weight change.  HENT: Negative.  Negative for congestion, ear pain, rhinorrhea, sneezing, sore throat and trouble swallowing.   Eyes: Negative.   Respiratory: Negative.  Negative for cough, chest tightness, shortness of breath and wheezing.   Cardiovascular: Negative.  Negative for chest pain and palpitations.  Gastrointestinal: Negative.  Negative for abdominal pain, blood in stool, constipation, diarrhea, nausea and vomiting.  Endocrine: Negative.   Genitourinary: Negative.  Negative for difficulty urinating, dysuria, frequency, hematuria and urgency.  Musculoskeletal:  Positive for arthralgias (acute right knee pain) and back pain. Negative for joint swelling, myalgias and neck pain.  Skin: Negative.  Negative for rash and wound.  Allergic/Immunologic: Negative.  Negative for immunocompromised state.  Neurological: Negative.  Negative for dizziness, seizures, numbness and headaches.  Hematological: Negative.   Psychiatric/Behavioral:  Negative for behavioral problems (Depression), self-injury, sleep disturbance and suicidal ideas. The patient is not nervous/anxious.     Vital Signs: BP 110/74   Pulse 72   Temp 98.4 F (36.9 C)   Resp 16   Ht 6' (1.829 m)   Wt 180 lb 12.8 oz (82 kg)   SpO2 97%   BMI 24.52 kg/m    Physical Exam Vitals reviewed.  Constitutional:      General: He is not in acute distress.    Appearance: Normal appearance. He is well-developed, well-groomed and overweight. He is not ill-appearing or diaphoretic.  HENT:     Head: Normocephalic and atraumatic.     Right Ear: Tympanic membrane, ear canal and external ear normal.     Left Ear: Tympanic membrane, ear canal and external ear normal.     Nose: Nose normal. No congestion or rhinorrhea.     Mouth/Throat:     Mouth: Mucous membranes are moist.     Pharynx: Oropharynx is clear. No oropharyngeal exudate or posterior oropharyngeal erythema.   Eyes:     General: Lids are normal. Vision  grossly intact. Gaze aligned appropriately. No scleral icterus.       Right eye: No discharge.        Left eye: No discharge.     Extraocular Movements: Extraocular movements intact.     Conjunctiva/sclera: Conjunctivae normal.     Pupils: Pupils are equal, round, and reactive to light.   Neck:     Thyroid: No thyromegaly.     Vascular: No JVD.     Trachea: Trachea and phonation normal. No tracheal deviation.   Cardiovascular:     Rate and Rhythm: Normal rate and regular rhythm.     Pulses: Normal pulses.     Heart sounds: Normal heart sounds, S1 normal and S2 normal. No murmur heard.    No friction rub. No gallop.  Pulmonary:     Effort: Pulmonary effort is normal. No accessory muscle usage or respiratory distress.     Breath sounds: Normal breath sounds and air entry. No stridor. No wheezing or rales.  Chest:     Chest wall: No tenderness.  Abdominal:  General: Bowel sounds are normal. There is no distension.     Palpations: Abdomen is soft. There is no shifting dullness, fluid wave, mass or pulsatile mass.     Tenderness: There is no abdominal tenderness. There is no guarding or rebound.   Musculoskeletal:        General: No tenderness or deformity. Normal range of motion.     Cervical back: Normal range of motion and neck supple.     Right lower leg: No edema.     Left lower leg: No edema.  Lymphadenopathy:     Cervical: No cervical adenopathy.   Skin:    General: Skin is warm and dry.     Capillary Refill: Capillary refill takes less than 2 seconds.     Coloration: Skin is not pale.     Findings: No erythema or rash.   Neurological:     Mental Status: He is alert and oriented to person, place, and time.     Cranial Nerves: No cranial nerve deficit.     Motor: No abnormal muscle tone.     Coordination: Coordination normal.     Gait: Gait normal.     Deep Tendon Reflexes: Reflexes are normal and symmetric.   Psychiatric:        Mood and Affect: Mood normal.         Behavior: Behavior normal. Behavior is cooperative.        Thought Content: Thought content normal.        Judgment: Judgment normal.        Assessment/Plan: 1. Encounter for routine adult health examination with abnormal findings (Primary) Age-appropriate preventive screenings and vaccinations discussed, annual physical exam completed. Routine labs for health maintenance ordered, see below. PHM updated.   - CBC with Differential/Platelet - CMP14+EGFR - Lipid Profile  2. Impaired fasting glucose Routine labs ordered  - CBC with Differential/Platelet - CMP14+EGFR - Lipid Profile - Hgb A1C w/o eAG  3. Mixed hyperlipidemia Routine labs ordered  - CBC with Differential/Platelet - CMP14+EGFR - Lipid Profile  4. Acute pain of right knee Manageable for now, will call clinic if pain worsens or if he decides he wants to see a specialist.  5. Chronic midline low back pain without sciatica Manageable with OTC medication as needed. No further intervention at this time.   6. ADHD (attention deficit hyperactivity disorder), predominantly hyperactive impulsive type Continue adderall as prescribed, follow up in 3 months for additional refills.  - amphetamine -dextroamphetamine  (ADDERALL XR) 15 MG 24 hr capsule; Take 1 capsule by mouth every morning.  Dispense: 30 capsule; Refill: 0 - amphetamine -dextroamphetamine  (ADDERALL) 10 MG tablet; Take 1 tablet (10 mg total) by mouth daily at 12 noon.  Dispense: 30 tablet; Refill: 0 - amphetamine -dextroamphetamine  (ADDERALL) 10 MG tablet; Take 1 tablet (10 mg total) by mouth daily at 12 noon.  Dispense: 30 tablet; Refill: 0 - amphetamine -dextroamphetamine  (ADDERALL XR) 15 MG 24 hr capsule; Take 1 capsule by mouth every morning.  Dispense: 30 capsule; Refill: 0 - amphetamine -dextroamphetamine  (ADDERALL) 10 MG tablet; Take 1 tablet (10 mg total) by mouth daily at 12 noon.  Dispense: 30 tablet; Refill: 0 - amphetamine -dextroamphetamine  (ADDERALL XR)  15 MG 24 hr capsule; Take 1 capsule by mouth every morning.  Dispense: 30 capsule; Refill: 0      General Counseling: Holley verbalizes understanding of the findings of todays visit and agrees with plan of treatment. I have discussed any further diagnostic evaluation that may be needed or ordered today. We  also reviewed his medications today. he has been encouraged to call the office with any questions or concerns that should arise related to todays visit.    Orders Placed This Encounter  Procedures   CBC with Differential/Platelet   CMP14+EGFR   Lipid Profile   Hgb A1C w/o eAG    Meds ordered this encounter  Medications   amphetamine -dextroamphetamine  (ADDERALL XR) 15 MG 24 hr capsule    Sig: Take 1 capsule by mouth every morning.    Dispense:  30 capsule    Refill:  0    Fill for june   amphetamine -dextroamphetamine  (ADDERALL) 10 MG tablet    Sig: Take 1 tablet (10 mg total) by mouth daily at 12 noon.    Dispense:  30 tablet    Refill:  0    In addition to extended release adderall prescription. Fill for june   amphetamine -dextroamphetamine  (ADDERALL) 10 MG tablet    Sig: Take 1 tablet (10 mg total) by mouth daily at 12 noon.    Dispense:  30 tablet    Refill:  0    In addition to extended release adderall prescription. Fill for july   amphetamine -dextroamphetamine  (ADDERALL XR) 15 MG 24 hr capsule    Sig: Take 1 capsule by mouth every morning.    Dispense:  30 capsule    Refill:  0    Fill for july   amphetamine -dextroamphetamine  (ADDERALL) 10 MG tablet    Sig: Take 1 tablet (10 mg total) by mouth daily at 12 noon.    Dispense:  30 tablet    Refill:  0    In addition to extended release adderall prescription. Fill for august   amphetamine -dextroamphetamine  (ADDERALL XR) 15 MG 24 hr capsule    Sig: Take 1 capsule by mouth every morning.    Dispense:  30 capsule    Refill:  0    Fill for August    Return in about 12 weeks (around 07/22/2024) for F/U, ADHD med check,  Devone Tousley PCP, need UDS at next office visit. .   Total time spent:30 Minutes Time spent includes review of chart, medications, test results, and follow up plan with the patient.   [place] Controlled Substance Database was reviewed by me.  This patient was seen by Laurence Pons, FNP-C in collaboration with Dr. Verneta Gone as a part of collaborative care agreement.  Carlie Solorzano R. Bobbi Burow, MSN, FNP-C Internal medicine

## 2024-05-13 ENCOUNTER — Ambulatory Visit: Admitting: Nurse Practitioner

## 2024-07-03 ENCOUNTER — Telehealth: Payer: Self-pay | Admitting: Nurse Practitioner

## 2024-07-03 NOTE — Telephone Encounter (Signed)
 PA request for extended primary care visits faxed to [facility] Admin-[name]

## 2024-07-09 ENCOUNTER — Telehealth: Payer: Self-pay | Admitting: Nurse Practitioner

## 2024-07-09 NOTE — Telephone Encounter (Signed)
 Per request from [facility] Admin, 03/28/23-current office notes sent to them via email.

## 2024-07-22 ENCOUNTER — Ambulatory Visit: Admitting: Nurse Practitioner

## 2024-08-05 ENCOUNTER — Encounter: Payer: Self-pay | Admitting: Nurse Practitioner

## 2024-08-05 ENCOUNTER — Ambulatory Visit: Admitting: Nurse Practitioner

## 2024-08-05 VITALS — BP 106/78 | HR 99 | Temp 97.6°F | Resp 16 | Ht 72.0 in | Wt 179.6 lb

## 2024-08-05 DIAGNOSIS — G8929 Other chronic pain: Secondary | ICD-10-CM

## 2024-08-05 DIAGNOSIS — Z79899 Other long term (current) drug therapy: Secondary | ICD-10-CM

## 2024-08-05 DIAGNOSIS — F901 Attention-deficit hyperactivity disorder, predominantly hyperactive type: Secondary | ICD-10-CM | POA: Diagnosis not present

## 2024-08-05 DIAGNOSIS — M545 Low back pain, unspecified: Secondary | ICD-10-CM | POA: Diagnosis not present

## 2024-08-05 LAB — POCT URINE DRUG SCREEN
Methylenedioxyamphetamine: NOT DETECTED
POC Amphetamine UR: POSITIVE — AB
POC BENZODIAZEPINES UR: NOT DETECTED
POC Barbiturate UR: NOT DETECTED
POC Cocaine UR: NOT DETECTED
POC Ecstasy UR: NOT DETECTED
POC Marijuana UR: POSITIVE — AB
POC Methadone UR: NOT DETECTED
POC Methamphetamine UR: NOT DETECTED — AB
POC Opiate Ur: NOT DETECTED
POC Oxycodone UR: NOT DETECTED
POC PHENCYCLIDINE UR: NOT DETECTED
POC TRICYCLICS UR: NOT DETECTED

## 2024-08-05 MED ORDER — AMPHETAMINE-DEXTROAMPHET ER 15 MG PO CP24: 15.0000 mg | ORAL_CAPSULE | ORAL | 0 refills | Status: DC

## 2024-08-05 MED ORDER — AMPHETAMINE-DEXTROAMPHETAMINE 10 MG PO TABS: 10.0000 mg | ORAL_TABLET | Freq: Every day | ORAL | 0 refills | Status: DC

## 2024-08-05 NOTE — Progress Notes (Signed)
 [facility] [address] [place], [place] [address]  Internal MEDICINE  Office Visit Note  Patient Name: Scott Mcgrath  [id number]  [id number]  Date of Service: 08/05/2024  Chief Complaint  Patient presents with   Depression   Follow-up    HPI Scott Mcgrath presents for a follow-up visit for ADHD, refills and UDS.  ADHD -- current dose remains effective. BP and heart rate are normal. He denies any palpitations or other side effects of the medication. Due for refills.  Chronic back pain -- no issues today.  Due for UDS today    Current Medication: Outpatient Encounter Medications as of 08/05/2024  Medication Sig   amphetamine -dextroamphetamine  (ADDERALL XR) 15 MG 24 hr capsule Take 1 capsule by mouth every morning.   [START ON 09/02/2024] amphetamine -dextroamphetamine  (ADDERALL XR) 15 MG 24 hr capsule Take 1 capsule by mouth every morning.   [START ON 09/30/2024] amphetamine -dextroamphetamine  (ADDERALL XR) 15 MG 24 hr capsule Take 1 capsule by mouth every morning.   amphetamine -dextroamphetamine  (ADDERALL) 10 MG tablet Take 1 tablet (10 mg total) by mouth daily at 12 noon.   [START ON 09/02/2024] amphetamine -dextroamphetamine  (ADDERALL) 10 MG tablet Take 1 tablet (10 mg total) by mouth daily at 12 noon.   [START ON 09/30/2024] amphetamine -dextroamphetamine  (ADDERALL) 10 MG tablet Take 1 tablet (10 mg total) by mouth daily at 12 noon.   [DISCONTINUED] amphetamine -dextroamphetamine  (ADDERALL XR) 15 MG 24 hr capsule Take 1 capsule by mouth every morning.   [DISCONTINUED] amphetamine -dextroamphetamine  (ADDERALL XR) 15 MG 24 hr capsule Take 1 capsule by mouth every morning.   [DISCONTINUED] amphetamine -dextroamphetamine  (ADDERALL XR) 15 MG 24 hr capsule Take 1 capsule by mouth every morning.   [DISCONTINUED] amphetamine -dextroamphetamine  (ADDERALL) 10 MG tablet Take 1 tablet (10 mg total) by mouth daily at 12 noon.   [DISCONTINUED] amphetamine -dextroamphetamine  (ADDERALL) 10 MG  tablet Take 1 tablet (10 mg total) by mouth daily at 12 noon.   [DISCONTINUED] amphetamine -dextroamphetamine  (ADDERALL) 10 MG tablet Take 1 tablet (10 mg total) by mouth daily at 12 noon.   No facility-administered encounter medications on file as of 08/05/2024.    Surgical History: History reviewed. No pertinent surgical history.  Medical History: Past Medical History:  Diagnosis Date   Depression     Family History: Family History  Problem Relation Age of Onset   Diabetes Mother    ADD / ADHD Brother    Asthma Brother     Social History   Socioeconomic History   Marital status: Single    Spouse name: Not on file   Number of children: Not on file   Years of education: Not on file   Highest education level: Not on file  Occupational History   Not on file  Tobacco Use   Smoking status: Never   Smokeless tobacco: Current    Types: Chew  Substance and Sexual Activity   Alcohol use: Yes    Comment: occasionally   Drug use: Yes    Types: Marijuana    Comment: every once in a while   Sexual activity: Not Currently  Other Topics Concern   Not on file  Social History Narrative   Not on file   Social Drivers of Health   Financial Resource Strain: Not on file  Food Insecurity: Not on file  Transportation Needs: Not on file  Physical Activity: Not on file  Stress: Not on file  Social Connections: Not on file  Intimate Partner Violence: Not on file      Review  of Systems  Constitutional:  Positive for fatigue. Negative for chills, diaphoresis and unexpected weight change.  HENT:  Negative for congestion, rhinorrhea, sneezing and sore throat.   Respiratory: Negative.  Negative for cough, chest tightness, shortness of breath and wheezing.   Cardiovascular: Negative.  Negative for chest pain and palpitations.  Gastrointestinal:  Negative for abdominal pain, constipation, diarrhea, nausea and vomiting.  Musculoskeletal:  Positive for arthralgias and back pain.  Negative for joint swelling and neck pain.  Skin:  Negative for rash.  Neurological: Negative.   Psychiatric/Behavioral:  Positive for decreased concentration. Negative for behavioral problems (Depression), sleep disturbance and suicidal ideas. The patient is not nervous/anxious.     Vital Signs: BP 106/78   Pulse 99   Temp 97.6 F (36.4 C)   Resp 16   Ht 6' (1.829 m)   Wt 179 lb 9.6 oz (81.5 kg)   SpO2 95%   BMI 24.36 kg/m    Physical Exam Vitals reviewed.  Constitutional:      General: He is not in acute distress.    Appearance: Normal appearance. He is not ill-appearing.  HENT:     Head: Normocephalic and atraumatic.  Eyes:     Pupils: Pupils are equal, round, and reactive to light.  Cardiovascular:     Rate and Rhythm: Normal rate and regular rhythm.  Pulmonary:     Effort: Pulmonary effort is normal. No respiratory distress.  Neurological:     Mental Status: He is alert and oriented to person, place, and time.  Psychiatric:        Mood and Affect: Mood normal.        Behavior: Behavior normal.        Assessment/Plan: 1. Chronic midline low back pain without sciatica (Primary) Stable, no issues at this time.   2. Long-term use of high-risk medication UDS done today, positive for Amphetamine  which is consistent with his current prescriptions. He was also positive for THC.  - POCT Urine Drug Screen  3. ADHD (attention deficit hyperactivity disorder), predominantly hyperactive impulsive type Continue adderall as prescribed, follow up in 3 months for additional refills.  - amphetamine -dextroamphetamine  (ADDERALL) 10 MG tablet; Take 1 tablet (10 mg total) by mouth daily at 12 noon.  Dispense: 30 tablet; Refill: 0 - amphetamine -dextroamphetamine  (ADDERALL) 10 MG tablet; Take 1 tablet (10 mg total) by mouth daily at 12 noon.  Dispense: 30 tablet; Refill: 0 - amphetamine -dextroamphetamine  (ADDERALL XR) 15 MG 24 hr capsule; Take 1 capsule by mouth every morning.   Dispense: 30 capsule; Refill: 0 - amphetamine -dextroamphetamine  (ADDERALL) 10 MG tablet; Take 1 tablet (10 mg total) by mouth daily at 12 noon.  Dispense: 30 tablet; Refill: 0 - amphetamine -dextroamphetamine  (ADDERALL XR) 15 MG 24 hr capsule; Take 1 capsule by mouth every morning.  Dispense: 30 capsule; Refill: 0 - amphetamine -dextroamphetamine  (ADDERALL XR) 15 MG 24 hr capsule; Take 1 capsule by mouth every morning.  Dispense: 30 capsule; Refill: 0   General Counseling: Nayan verbalizes understanding of the findings of todays visit and agrees with plan of treatment. I have discussed any further diagnostic evaluation that may be needed or ordered today. We also reviewed his medications today. he has been encouraged to call the office with any questions or concerns that should arise related to todays visit.    Orders Placed This Encounter  Procedures   POCT Urine Drug Screen    Meds ordered this encounter  Medications   amphetamine -dextroamphetamine  (ADDERALL) 10 MG tablet    Sig:  Take 1 tablet (10 mg total) by mouth daily at 12 noon.    Dispense:  30 tablet    Refill:  0    In addition to extended release adderall prescription. Fill for September.   amphetamine -dextroamphetamine  (ADDERALL) 10 MG tablet    Sig: Take 1 tablet (10 mg total) by mouth daily at 12 noon.    Dispense:  30 tablet    Refill:  0    In addition to extended release adderall prescription. Fill for october   amphetamine -dextroamphetamine  (ADDERALL XR) 15 MG 24 hr capsule    Sig: Take 1 capsule by mouth every morning.    Dispense:  30 capsule    Refill:  0    Fill for september   amphetamine -dextroamphetamine  (ADDERALL) 10 MG tablet    Sig: Take 1 tablet (10 mg total) by mouth daily at 12 noon.    Dispense:  30 tablet    Refill:  0    In addition to extended release adderall prescription. Fill for november   amphetamine -dextroamphetamine  (ADDERALL XR) 15 MG 24 hr capsule    Sig: Take 1 capsule by mouth every  morning.    Dispense:  30 capsule    Refill:  0    Fill for october   amphetamine -dextroamphetamine  (ADDERALL XR) 15 MG 24 hr capsule    Sig: Take 1 capsule by mouth every morning.    Dispense:  30 capsule    Refill:  0    Fill for november    Return in about 12 weeks (around 10/28/2024) for F/U, ADHD med check, Jasmene Goswami PCP.   Total time spent:30 Minutes Time spent includes review of chart, medications, test results, and follow up plan with the patient.   [place] Controlled Substance Database was reviewed by me.  This patient was seen by Mardy Maxin, FNP-C in collaboration with Dr. Sigrid Bathe as a part of collaborative care agreement.   Irianna Gilday R. Maxin, MSN, FNP-C Internal medicine

## 2024-09-09 ENCOUNTER — Telehealth: Payer: Self-pay | Admitting: Nurse Practitioner

## 2024-09-09 NOTE — Telephone Encounter (Signed)
 Per request, 08/05/24 office notes faxed to [facility]; [phone]

## 2024-09-17 ENCOUNTER — Ambulatory Visit: Admitting: Nurse Practitioner

## 2024-09-17 ENCOUNTER — Encounter: Payer: Self-pay | Admitting: Nurse Practitioner

## 2024-09-17 VITALS — BP 100/66 | HR 99 | Temp 98.2°F | Resp 16 | Ht 72.0 in | Wt 179.4 lb

## 2024-09-17 DIAGNOSIS — M4722 Other spondylosis with radiculopathy, cervical region: Secondary | ICD-10-CM

## 2024-09-17 DIAGNOSIS — M4310 Spondylolisthesis, site unspecified: Secondary | ICD-10-CM

## 2024-09-17 MED ORDER — CYCLOBENZAPRINE HCL 10 MG PO TABS: 10.0000 mg | ORAL_TABLET | Freq: Three times a day (TID) | ORAL | 0 refills | Status: AC | PRN

## 2024-09-17 NOTE — Progress Notes (Signed)
 [facility] [address] [place], [place] [address]  Internal MEDICINE  Office Visit Note  Patient Name: Scott Mcgrath  [id number]  [id number]  Date of Service: 09/17/2024  Chief Complaint  Patient presents with   Acute Visit    Referral to ortho     HPI [name] presents for an acute sick visit for neck pain with radiating nerve pain down the left arm.  --onset of pain was 2 weeks ago. Went to urgent care and they did xrays  The xrays shower grade 1 retrolisthesis of the C5-C6 vertebrae and with disc space narrowing.  -- pain shooting down left shoulder and left arm Just finished a prednisone  taper.  He has a small amount of hydrocodone for pain if he needs it. He only takes it at night since he cannot take it while he is working.  He was also prescribed metaxalone by the urgent care provider but his insurance would not let him fill it due to coverage.      Current Medication:  Outpatient Encounter Medications as of 09/17/2024  Medication Sig   cyclobenzaprine (FLEXERIL) 10 MG tablet Take 1 tablet (10 mg total) by mouth 3 (three) times daily as needed for muscle spasms.   HYDROcodone-acetaminophen  (NORCO/VICODIN) 5-325 MG tablet Take 1 tablet by mouth.   [DISCONTINUED] predniSONE  (STERAPRED UNI-PAK 21 TAB) 10 MG (21) TBPK tablet Take by mouth.   amphetamine -dextroamphetamine  (ADDERALL XR) 15 MG 24 hr capsule Take 1 capsule by mouth every morning.   amphetamine -dextroamphetamine  (ADDERALL XR) 15 MG 24 hr capsule Take 1 capsule by mouth every morning.   [START ON 09/30/2024] amphetamine -dextroamphetamine  (ADDERALL XR) 15 MG 24 hr capsule Take 1 capsule by mouth every morning.   amphetamine -dextroamphetamine  (ADDERALL) 10 MG tablet Take 1 tablet (10 mg total) by mouth daily at 12 noon.   amphetamine -dextroamphetamine  (ADDERALL) 10 MG tablet Take 1 tablet (10 mg total) by mouth daily at 12 noon.   [START ON 09/30/2024] amphetamine -dextroamphetamine  (ADDERALL) 10 MG  tablet Take 1 tablet (10 mg total) by mouth daily at 12 noon.   No facility-administered encounter medications on file as of 09/17/2024.      Medical History: Past Medical History:  Diagnosis Date   Depression      Vital Signs: BP 100/66   Pulse 99   Temp 98.2 F (36.8 C)   Resp 16   Ht 6' (1.829 m)   Wt 179 lb 6.4 oz (81.4 kg)   SpO2 97%   BMI 24.33 kg/m    Review of Systems  Constitutional:  Positive for activity change and fatigue.  Respiratory: Negative.  Negative for cough, chest tightness, shortness of breath and wheezing.   Cardiovascular: Negative.  Negative for chest pain and palpitations.  Musculoskeletal:  Positive for arthralgias, back pain, gait problem, myalgias and neck pain.    Physical Exam Vitals reviewed.  Constitutional:      General: He is not in acute distress.    Appearance: Normal appearance. He is normal weight. He is not ill-appearing.  HENT:     Head: Normocephalic and atraumatic.  Eyes:     Pupils: Pupils are equal, round, and reactive to light.  Cardiovascular:     Rate and Rhythm: Normal rate and regular rhythm.  Pulmonary:     Effort: Pulmonary effort is normal. No respiratory distress.  Neurological:     Mental Status: He is alert and oriented to person, place, and time.  Psychiatric:        Mood  and Affect: Mood normal.        Behavior: Behavior normal.       Assessment/Plan: 1. 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 Frederic Don. Take cyclobenzaprine as needed for musculoskeletal pain.  - Ambulatory referral to Spine Surgery - cyclobenzaprine (FLEXERIL) 10 MG tablet; Take 1 tablet (10 mg total) by mouth 3 (three) times daily as needed for muscle spasms.  Dispense: 30 tablet; Refill: 0  2. Retrolisthesis of vertebrae Referred to orthopedic surgery spine specialist at [facility], Dr. Frederic Don. Take cyclobenzaprine as needed for  musculoskeletal pain.  - Ambulatory referral to Spine Surgery - cyclobenzaprine (FLEXERIL) 10 MG tablet; Take 1 tablet (10 mg total) by mouth 3 (three) times daily as needed for muscle spasms.  Dispense: 30 tablet; Refill: 0   General Counseling: [name] verbalizes understanding of the findings of todays visit and agrees with plan of treatment. I have discussed any further diagnostic evaluation that may be needed or ordered today. We also reviewed his medications today. he has been encouraged to call the office with any questions or concerns that should arise related to todays visit.    Counseling:    Orders Placed This Encounter  Procedures   Ambulatory referral to Spine Surgery    Meds ordered this encounter  Medications   cyclobenzaprine (FLEXERIL) 10 MG tablet    Sig: Take 1 tablet (10 mg total) by mouth 3 (three) times daily as needed for muscle spasms.    Dispense:  30 tablet    Refill:  0    Fill new script today    Return if symptoms worsen or fail to improve, for referred to spine doctor. .  [place] Controlled Substance Database was reviewed by me for overdose risk score (ORS)  Time spent:30 Minutes Time spent with patient included reviewing progress notes, labs, imaging studies, and discussing plan for follow up.   This patient was seen by Mardy Maxin, FNP-C in collaboration with Dr. Sigrid Bathe as a part of collaborative care agreement.  Marquerite Forsman R. Maxin, MSN, FNP-C Internal Medicine

## 2024-09-18 ENCOUNTER — Telehealth: Payer: Self-pay | Admitting: Nurse Practitioner

## 2024-09-18 NOTE — Telephone Encounter (Signed)
 PA request for orthopedics faxed to [place]; [phone]

## 2024-10-13 ENCOUNTER — Telehealth: Payer: Self-pay | Admitting: Nurse Practitioner

## 2024-10-13 NOTE — Telephone Encounter (Signed)
 Finally got thru to [facility] regarding orthopedic referral request. Was given fax # [phone]-[name]

## 2024-10-28 ENCOUNTER — Encounter: Payer: Self-pay | Admitting: Nurse Practitioner

## 2024-10-28 ENCOUNTER — Ambulatory Visit: Admitting: Nurse Practitioner

## 2024-10-28 VITALS — BP 120/74 | HR 96 | Temp 96.4°F | Resp 16 | Ht 72.0 in | Wt 180.6 lb

## 2024-10-28 DIAGNOSIS — M4722 Other spondylosis with radiculopathy, cervical region: Secondary | ICD-10-CM

## 2024-10-28 DIAGNOSIS — F901 Attention-deficit hyperactivity disorder, predominantly hyperactive type: Secondary | ICD-10-CM | POA: Diagnosis not present

## 2024-10-28 DIAGNOSIS — M545 Low back pain, unspecified: Secondary | ICD-10-CM

## 2024-10-28 DIAGNOSIS — G8929 Other chronic pain: Secondary | ICD-10-CM

## 2024-10-28 MED ORDER — AMPHETAMINE-DEXTROAMPHETAMINE 10 MG PO TABS: 10.0000 mg | ORAL_TABLET | Freq: Every day | ORAL | 0 refills | Status: AC

## 2024-10-28 MED ORDER — AMPHETAMINE-DEXTROAMPHET ER 15 MG PO CP24: 15.0000 mg | ORAL_CAPSULE | ORAL | 0 refills | Status: AC

## 2024-10-28 NOTE — Progress Notes (Signed)
 [facility] [address] [place], [place] [address]  Internal MEDICINE  Office Visit Note  Patient Name: Scott Mcgrath  [id number]  [id number]  Date of Service: 10/28/2024  Chief Complaint  Patient presents with   Depression   Follow-up    HPI Taym presents for a follow-up visit for ADHD, refills and UDS.  ADHD -- current dose remains effective. BP and heart rate are normal. He denies any palpitations or other side effects of the medication. Due for refills.  Chronic back pain -- no issues today.  UDS done at September visit, due again in 3 more months.  Recently referred to spine surgery for spondylosis of the cervical spine --waiting for referral to be approved by the [place].      Current Medication: Outpatient Encounter Medications as of 10/28/2024  Medication Sig   amphetamine -dextroamphetamine  (ADDERALL XR) 15 MG 24 hr capsule Take 1 capsule by mouth every morning.   [START ON 11/25/2024] amphetamine -dextroamphetamine  (ADDERALL XR) 15 MG 24 hr capsule Take 1 capsule by mouth every morning.   [START ON 12/23/2024] amphetamine -dextroamphetamine  (ADDERALL XR) 15 MG 24 hr capsule Take 1 capsule by mouth every morning.   amphetamine -dextroamphetamine  (ADDERALL) 10 MG tablet Take 1 tablet (10 mg total) by mouth daily at 12 noon.   [START ON 11/25/2024] amphetamine -dextroamphetamine  (ADDERALL) 10 MG tablet Take 1 tablet (10 mg total) by mouth daily at 12 noon.   [START ON 12/23/2024] amphetamine -dextroamphetamine  (ADDERALL) 10 MG tablet Take 1 tablet (10 mg total) by mouth daily at 12 noon.   cyclobenzaprine  (FLEXERIL ) 10 MG tablet Take 1 tablet (10 mg total) by mouth 3 (three) times daily as needed for muscle spasms.   HYDROcodone-acetaminophen  (NORCO/VICODIN) 5-325 MG tablet Take 1 tablet by mouth.   [DISCONTINUED] amphetamine -dextroamphetamine  (ADDERALL XR) 15 MG 24 hr capsule Take 1 capsule by mouth every morning.   [DISCONTINUED] amphetamine -dextroamphetamine   (ADDERALL XR) 15 MG 24 hr capsule Take 1 capsule by mouth every morning.   [DISCONTINUED] amphetamine -dextroamphetamine  (ADDERALL XR) 15 MG 24 hr capsule Take 1 capsule by mouth every morning.   [DISCONTINUED] amphetamine -dextroamphetamine  (ADDERALL) 10 MG tablet Take 1 tablet (10 mg total) by mouth daily at 12 noon.   [DISCONTINUED] amphetamine -dextroamphetamine  (ADDERALL) 10 MG tablet Take 1 tablet (10 mg total) by mouth daily at 12 noon.   [DISCONTINUED] amphetamine -dextroamphetamine  (ADDERALL) 10 MG tablet Take 1 tablet (10 mg total) by mouth daily at 12 noon.   No facility-administered encounter medications on file as of 10/28/2024.    Surgical History: History reviewed. No pertinent surgical history.  Medical History: Past Medical History:  Diagnosis Date   Depression     Family History: Family History  Problem Relation Age of Onset   Diabetes Mother    ADD / ADHD Brother    Asthma Brother     Social History   Socioeconomic History   Marital status: Single    Spouse name: Not on file   Number of children: Not on file   Years of education: Not on file   Highest education level: Not on file  Occupational History   Not on file  Tobacco Use   Smoking status: Never   Smokeless tobacco: Current    Types: Chew  Substance and Sexual Activity   Alcohol use: Yes    Comment: occasionally   Drug use: Yes    Types: Marijuana    Comment: every once in a while   Sexual activity: Not Currently  Other Topics Concern   Not on  file  Social History Narrative   Not on file   Social Drivers of Health   Tobacco Use: High Risk (10/28/2024)   Patient History    Smoking Tobacco Use: Never    Smokeless Tobacco Use: Current    Passive Exposure: Not on file  Financial Resource Strain: Not on file  Food Insecurity: Not on file  Transportation Needs: Not on file  Physical Activity: Not on file  Stress: Not on file  Social Connections: Not on file  Intimate Partner Violence:  Not on file  Depression (PHQ2-9): Low Risk (04/29/2024)   Depression (PHQ2-9)    PHQ-2 Score: 0  Alcohol Screen: Not on file  Housing: Unknown (09/07/2024)   Received from [facility]   Epic    Unable to Pay for Housing in the Last Year: Not on file    Number of Times Moved in the Last Year: Not on file    At any time in the past 12 months, were you homeless or living in a shelter (including now)?: No  Utilities: Not on file  Health Literacy: Not on file      Review of Systems  Constitutional:  Positive for fatigue. Negative for chills, diaphoresis and unexpected weight change.  HENT:  Negative for congestion, rhinorrhea, sneezing and sore throat.   Respiratory: Negative.  Negative for cough, chest tightness, shortness of breath and wheezing.   Cardiovascular: Negative.  Negative for chest pain and palpitations.  Gastrointestinal:  Negative for abdominal pain, constipation, diarrhea, nausea and vomiting.  Musculoskeletal:  Positive for arthralgias, back pain and neck pain. Negative for joint swelling.  Skin:  Negative for rash.  Neurological: Negative.   Psychiatric/Behavioral:  Positive for decreased concentration and depression. Negative for behavioral problems (Depression), sleep disturbance and suicidal ideas. The patient is not nervous/anxious.     Vital Signs: BP 120/74   Pulse 96   Temp (!) 96.4 F (35.8 C)   Resp 16   Ht 6' (1.829 m)   Wt 180 lb 9.6 oz (81.9 kg)   SpO2 97%   BMI 24.49 kg/m    Physical Exam Vitals reviewed.  Constitutional:      General: He is not in acute distress.    Appearance: Normal appearance. He is not ill-appearing.  HENT:     Head: Normocephalic and atraumatic.  Eyes:     Pupils: Pupils are equal, round, and reactive to light.  Cardiovascular:     Rate and Rhythm: Normal rate and regular rhythm.  Pulmonary:     Effort: Pulmonary effort is normal. No respiratory distress.  Neurological:     Mental Status: He is  alert and oriented to person, place, and time.  Psychiatric:        Mood and Affect: Mood normal.        Behavior: Behavior normal.        Assessment/Plan: 1. 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   2. Chronic midline low back pain without sciatica No issues today, takes OTC NSAIDS as needed.   3. ADHD (attention deficit hyperactivity disorder), predominantly hyperactive impulsive type Continue adderall as prescribed. Follow up in 3 months for additional refills. UDS due next week  - amphetamine -dextroamphetamine  (ADDERALL) 10 MG tablet; Take 1 tablet (10 mg total) by mouth daily at 12 noon.  Dispense: 30 tablet; Refill: 0 - amphetamine -dextroamphetamine  (ADDERALL) 10 MG tablet; Take 1 tablet (10 mg total) by mouth daily  at 12 noon.  Dispense: 30 tablet; Refill: 0 - amphetamine -dextroamphetamine  (ADDERALL) 10 MG tablet; Take 1 tablet (10 mg total) by mouth daily at 12 noon.  Dispense: 30 tablet; Refill: 0 - amphetamine -dextroamphetamine  (ADDERALL XR) 15 MG 24 hr capsule; Take 1 capsule by mouth every morning.  Dispense: 30 capsule; Refill: 0 - amphetamine -dextroamphetamine  (ADDERALL XR) 15 MG 24 hr capsule; Take 1 capsule by mouth every morning.  Dispense: 30 capsule; Refill: 0 - amphetamine -dextroamphetamine  (ADDERALL XR) 15 MG 24 hr capsule; Take 1 capsule by mouth every morning.  Dispense: 30 capsule; Refill: 0   General Counseling: Ranferi verbalizes understanding of the findings of todays visit and agrees with plan of treatment. I have discussed any further diagnostic evaluation that may be needed or ordered today. We also reviewed his medications today. he has been encouraged to call the office with any questions or concerns that should arise related to todays visit.    No orders of the defined types were placed in this encounter.   Meds ordered this encounter  Medications    amphetamine -dextroamphetamine  (ADDERALL) 10 MG tablet    Sig: Take 1 tablet (10 mg total) by mouth daily at 12 noon.    Dispense:  30 tablet    Refill:  0    In addition to extended release adderall prescription. Fill for december   amphetamine -dextroamphetamine  (ADDERALL) 10 MG tablet    Sig: Take 1 tablet (10 mg total) by mouth daily at 12 noon.    Dispense:  30 tablet    Refill:  0    In addition to extended release adderall prescription. Fill for january   amphetamine -dextroamphetamine  (ADDERALL) 10 MG tablet    Sig: Take 1 tablet (10 mg total) by mouth daily at 12 noon.    Dispense:  30 tablet    Refill:  0    In addition to extended release adderall prescription. Fill for february   amphetamine -dextroamphetamine  (ADDERALL XR) 15 MG 24 hr capsule    Sig: Take 1 capsule by mouth every morning.    Dispense:  30 capsule    Refill:  0    Fill for december   amphetamine -dextroamphetamine  (ADDERALL XR) 15 MG 24 hr capsule    Sig: Take 1 capsule by mouth every morning.    Dispense:  30 capsule    Refill:  0    Fill for january   amphetamine -dextroamphetamine  (ADDERALL XR) 15 MG 24 hr capsule    Sig: Take 1 capsule by mouth every morning.    Dispense:  30 capsule    Refill:  0    Fill for february    Return in about 3 months (around 01/19/2025) for F/U, ADHD med check, Ekansh Sherk PCP, due for UDS at next visit.   Total time spent:30 Minutes Time spent includes review of chart, medications, test results, and follow up plan with the patient.   [place] Controlled Substance Database was reviewed by me.  This patient was seen by Mardy Maxin, FNP-C in collaboration with Dr. Sigrid Bathe as a part of collaborative care agreement.   Marquette Blodgett R. Maxin, MSN, FNP-C Internal medicine

## 2024-10-29 ENCOUNTER — Telehealth: Payer: Self-pay | Admitting: Nurse Practitioner

## 2024-10-29 NOTE — Telephone Encounter (Signed)
 S/w Mr. Scott Mcgrath w/ [facility] regarding orthopedic referral approval. He stated approval is still pending, but will push through to expedite-[name]

## 2024-12-17 ENCOUNTER — Telehealth: Payer: Self-pay | Admitting: Nurse Practitioner

## 2024-12-17 NOTE — Telephone Encounter (Signed)
 S/w Heron [facility] regarding [facility]. She stated referral was denied. Patient needs to have MRI of spine done, then resubmit request with MRI results. Sent message to [employer]. Non-working tele # for patient lvm w/ mother-[name]

## 2025-01-19 ENCOUNTER — Ambulatory Visit: Admitting: Nurse Practitioner

## 2025-04-30 ENCOUNTER — Encounter: Admitting: Nurse Practitioner
